# Patient Record
Sex: Female | Born: 1980 | Race: White | Hispanic: No | Marital: Married | Smoking: Never smoker
Health system: Southern US, Community
[De-identification: ages and names within clinical notes are randomized; demographics above are authoritative.]

## PROBLEM LIST (undated history)

## (undated) ENCOUNTER — Inpatient Hospital Stay (HOSPITAL_COMMUNITY): Payer: Self-pay

## (undated) DIAGNOSIS — F32A Depression, unspecified: Secondary | ICD-10-CM

## (undated) DIAGNOSIS — Z9889 Other specified postprocedural states: Secondary | ICD-10-CM

## (undated) DIAGNOSIS — T8859XA Other complications of anesthesia, initial encounter: Secondary | ICD-10-CM

## (undated) DIAGNOSIS — F419 Anxiety disorder, unspecified: Secondary | ICD-10-CM

## (undated) DIAGNOSIS — K219 Gastro-esophageal reflux disease without esophagitis: Secondary | ICD-10-CM

## (undated) DIAGNOSIS — R51 Headache: Secondary | ICD-10-CM

## (undated) DIAGNOSIS — J45909 Unspecified asthma, uncomplicated: Secondary | ICD-10-CM

## (undated) DIAGNOSIS — T4145XA Adverse effect of unspecified anesthetic, initial encounter: Secondary | ICD-10-CM

## (undated) DIAGNOSIS — I10 Essential (primary) hypertension: Secondary | ICD-10-CM

## (undated) DIAGNOSIS — E611 Iron deficiency: Secondary | ICD-10-CM

## (undated) DIAGNOSIS — M1990 Unspecified osteoarthritis, unspecified site: Secondary | ICD-10-CM

## (undated) DIAGNOSIS — O009 Unspecified ectopic pregnancy without intrauterine pregnancy: Secondary | ICD-10-CM

## (undated) DIAGNOSIS — O139 Gestational [pregnancy-induced] hypertension without significant proteinuria, unspecified trimester: Secondary | ICD-10-CM

## (undated) DIAGNOSIS — F329 Major depressive disorder, single episode, unspecified: Secondary | ICD-10-CM

## (undated) DIAGNOSIS — Z8719 Personal history of other diseases of the digestive system: Secondary | ICD-10-CM

## (undated) HISTORY — PX: PLANTAR FASCIA SURGERY: SHX746

## (undated) HISTORY — DX: Gestational (pregnancy-induced) hypertension without significant proteinuria, unspecified trimester: O13.9

## (undated) HISTORY — DX: Headache: R51

## (undated) HISTORY — DX: Adverse effect of unspecified anesthetic, initial encounter: T41.45XA

## (undated) HISTORY — PX: OTHER SURGICAL HISTORY: SHX169

## (undated) HISTORY — DX: Unspecified asthma, uncomplicated: J45.909

## (undated) HISTORY — DX: Major depressive disorder, single episode, unspecified: F32.9

## (undated) HISTORY — DX: Unspecified ectopic pregnancy without intrauterine pregnancy: O00.90

## (undated) HISTORY — DX: Essential (primary) hypertension: I10

## (undated) HISTORY — PX: UPPER GI ENDOSCOPY: SHX6162

## (undated) HISTORY — DX: Iron deficiency: E61.1

## (undated) HISTORY — DX: Other complications of anesthesia, initial encounter: T88.59XA

## (undated) HISTORY — DX: Depression, unspecified: F32.A

## (undated) HISTORY — PX: COLPOSCOPY VULVA: SUR281

## (undated) HISTORY — DX: Anxiety disorder, unspecified: F41.9

---

## 2003-12-16 HISTORY — PX: WISDOM TOOTH EXTRACTION: SHX21

## 2006-08-04 ENCOUNTER — Other Ambulatory Visit: Admission: RE | Admit: 2006-08-04 | Discharge: 2006-08-04 | Payer: Self-pay | Admitting: Family Medicine

## 2008-03-06 ENCOUNTER — Inpatient Hospital Stay (HOSPITAL_COMMUNITY): Admission: AD | Admit: 2008-03-06 | Discharge: 2008-03-09 | Payer: Self-pay

## 2008-03-07 ENCOUNTER — Encounter (INDEPENDENT_AMBULATORY_CARE_PROVIDER_SITE_OTHER): Payer: Self-pay | Admitting: Obstetrics and Gynecology

## 2010-06-12 ENCOUNTER — Encounter: Admission: RE | Admit: 2010-06-12 | Discharge: 2010-06-12 | Payer: Self-pay | Admitting: Family Medicine

## 2011-04-29 NOTE — H&P (Signed)
NAMEBLAKELEY, SCHEIER      ACCOUNT NO.:  [id number]   MEDICAL RECORD NO.:  [id number]          PATIENT TYPE:  INP   LOCATION:                                FACILITY:  [facility]   PHYSICIAN:  Janine Limbo, M.D.DATE OF BIRTH:  26-Dec-1980   DATE OF ADMISSION:  03/06/2008  DATE OF DISCHARGE:                              HISTORY & PHYSICAL   Patient is a 30 year old married white female gravida 2, para 0-0-1-0,  who is at 40-3/[redacted] weeks gestation, who presents on the day of admission  for induction of labor, secondary to oligohydramnios.  Patient was seen  at the office this morning for an ultrasound for size less than dates.  She had been running about a week to a week and a half behind on her  previous two visits, so she had the ultrasound this morning.  It showed  AFI equal to 5.7 cm which was less than third percentile.  Baby's  estimated fetal weight was 7 pounds 8 ounces which is 48th percentile  and umbilical artery Doppler was 1.07, which was slightly high, the  normal range is 0.66-1.03.  Patient's biophysical profile was 8/8.  Patient was vertex per ultrasound.  Placenta is posterior and grade III.  Patient did report decreased fetal movement over the weekend.  Her urine  was negative.  Her weight is 186 today.  Blood pressure was within  normal limits.  She denies any leakage of fluid, any vaginal bleeding,  any abnormal discharge.  She denies any PIH or UTI signs or symptoms,  shortness of breath, fever or cough.  She does not report any  contractions at this point.   PRENATAL LABORATORY DATA:  Patient's blood type is A positive.  Rh  antibody screen was negative.  Iron count was 13.3 at her new OB visit  in May 2008.  RPR nonreactive.  Rubella titer immune.  Hepatitis B  surface antigen negative.  HIV was nonreactive.  Her group B Strep is  negative.  GC and Chlamydia  cultures in third trimester were negative.  Hemoglobin and one hour Glucola was within normal  limits, it was equal  to 82.  Her hemoglobin was 12.4 at her one hour Glucola.   PAST MEDICAL HISTORY:  1. Patient reports menarche at 30 years of age with 28-day cycle.  2. She reports and ectopic pregnancy in March 2008.  3. She did use Ortho Tri-Cyclen for approximately nine years which she      stopped in December 2007.  4. She was treated for Chlamydia in 2001.  5. She had normal childhood illnesses as well as varicella as a child.  6. Patient does have a history of asthma.  She has a p.r.n. albuterol      inhaler.   PAST SURGICAL HISTORY:  Patient had her wisdom teeth out in the past.   GENETIC HISTORY:  Remarkable for a first cousin with webbed toes, first  cousin with a heart defect and is since deceased.  Her paternal  grandmother and the father of the baby's mom with scoliosis.   FAMILY HISTORY:  Maternal grandfather with heart attacks.  Maternal  grandmother heart attacks. Dad and paternal grandmother chronic  hypertension and on medications.  Maternal grandmother emphysema.  Paternal grandfather emphysema and deceased.  Maternal grandfather  diabetic on oral medications.  Paternal grandfather is deceased but he,  as well, was diabetic.  Dad is on thyroid medication.  Her maternal  grandmother breast cancer.   SOCIAL HISTORY:  Patient is a married white female.  She did not report  a religious affiliation.  Her husband's name is Rob Art therapist.  She does  report having a bachelor's degree and is Public house manager  full time.  Father of the baby has an Associate's degree and works for  [employer].  He is supportive.  Patient denies any alcohol,  tobacco or illicit drug use.   OBSTETRICAL HISTORY:  Rhett Bannister 1 was ectopic pregnancy in March 2008.  Rhett Bannister 2 is present pregnancy with EDC set by approximately 7-8 week  ultrasound for March 03, 2008.   HISTORY OF PRESENT PREGNANCY:  Patient initiated care at [facility] in July  2008 for her new OB interview.   Ultrasound was scheduled for her history  of recent ectopic pregnancy.  She had her new OB work-up at 11 weeks  that was toward the end of August 2008.  She desired C.N.M. for care.  She did have her Pap in spring 2008, per patient.  GC and Chlamydia  cultures were negative.  Patient did have some nausea and vomiting in  early pregnancy which was easily treatable with Zofran p.r.n.  She was  then seen at 15 weeks and was doing well, improving nausea and vomiting.  She was then seen at 18 weeks and had her anatomy ultrasound showing  SIUP, size consistent with dates, cervical length 3.93 cm, normal fluid,  posterior placenta, all anatomy was seen with normal growth and  development with exception of unable to see right outflow tract and plan  was made to follow up in two weeks to reassess.  There were no anomalies  observed at that time, suggest a female.  She did have some questionable  dyshydrotic eczema of her hands versus contact dermatitis or tinea  manuum.  Plan was made for referral to dermatology consult but by the  time that the consult was available, the eczematous areas had resolved.  They were around her left ring finger and they were pruritic as well and  then on her right middle and index fingers.  Patient had follow-up  ultrasound at 20-3/7 weeks for additional observation of right outflow  tract and all anatomy that was not previously seen was observed and no  anomalies were observed.  Cervical length was 3.21 cm, normal fluid with  posterior placenta.  Patient then had her one-hour GTT at 28-5/7 weeks  which was within normal limits and hemoglobin at that time was 12.4.  She was doing well.  Patient's pregnancy continued to progress without  significant complications until her visit today when ultrasound was done  secondary to size slightly less than dates with fundal heights and  showing oligohydramnios.   OBJECTIVE:  VITAL SIGNS:  Patient's weight today at the office was  186.  Her UA was negative.  Her blood pressure was 120/88.  Pulse was within  normal limits.  She did not have a temperature taken here.  GENERAL APPEARANCE:  No acute distress.  Alert and oriented x3 and  pleasant.  HEENT:  Grossly intact and within normal limits.  SKIN:  Warm, dry and intact.  LUNGS:  Clear to auscultation bilaterally.  CARDIOVASCULAR:  Regular rate and rhythm without murmur.  ABDOMEN:  Soft and nontender.  Gravid.  PELVIC:  Cervix was fingertip, 50%, -2 and vertex.  No abnormal  discharge or leakage of fluid.  EXTREMITIES:  Within normal limits.  Deep tendon reflexes were 2+.  No  clonus.  Negative Homan's and no edema.   Ultrasound:  Please see history of present illness.   IMPRESSION:  1. Intrauterine pregnancy at 40-3/7 weeks.  2. Oligohydramnios with amniotic fluid index equal to 5.7, less than      third percentile on ultrasound today.  3. Slightly elevated umbilical right renal artery Doppler at 1.07 with      normal range 0.66-1.03.  4. Group Beta Strep negative.  5. Penicillin allergy.   PLAN:  1. Consulted with Dr. Stefano Gaul.  Per Dr. Stefano Gaul, patient was offered      option of      a.     Going home on bed rest and drinking increased amount of       fluids each day and coming back Wednesday or Thursday for repeat       ultrasound, or      b.     Direct admission for induction of labor secondary to       oligohydramnios.  2. Patient did opt for induction of labor secondary to decreased fetal      movement and discuss with Hilda Lias L. Mayford Knife, C.N.M., on call      patient status and election of induction.  3. Routine C.N.M. orders and admit to L&D as direct admit with Dr.      Stefano Gaul as attending physician.  4. Plan Cervidil or Cytotec p.r.n. cervical ripening and Pitocin and      artificial rupture of membranes thereafter for continued      augmentation.      Candice Denny Levy, [place]      Janine Limbo, M.D.  Electronically  Signed    CHS/MEDQ  D:  03/06/2008  T:  03/06/2008  Job:  [id number]

## 2011-09-08 LAB — CBC
HCT: 33.3 — ABNORMAL LOW
HCT: 39.3
MCHC: 35.1
MCHC: 35.2
MCV: 88.5
Platelets: 259
RBC: 3.73 — ABNORMAL LOW
RBC: 4.44
RDW: 13.3
RDW: 14.1
WBC: 11 — ABNORMAL HIGH
WBC: 18 — ABNORMAL HIGH

## 2012-04-28 ENCOUNTER — Other Ambulatory Visit: Payer: Self-pay | Admitting: Obstetrics and Gynecology

## 2012-05-06 ENCOUNTER — Other Ambulatory Visit: Payer: Self-pay | Admitting: Obstetrics and Gynecology

## 2012-05-06 MED ORDER — NORETHIN ACE-ETH ESTRAD-FE 1.5-30 MG-MCG PO TABS: 1.0000 | ORAL_TABLET | Freq: Every day | ORAL | Status: DC

## 2012-05-06 NOTE — Telephone Encounter (Signed)
TC to pt. LM to return call regarding message. 

## 2012-05-06 NOTE — Telephone Encounter (Signed)
Triage/epic 

## 2012-05-06 NOTE — Telephone Encounter (Signed)
TC from pt.   Requesting RF Microgestin  Until annual 05/25/12.  Denies any medical problems.  Needs to start new pack 05/07/12.  Rx ordered.

## 2012-05-24 DIAGNOSIS — R519 Headache, unspecified: Secondary | ICD-10-CM | POA: Insufficient documentation

## 2012-05-24 DIAGNOSIS — J45909 Unspecified asthma, uncomplicated: Secondary | ICD-10-CM | POA: Insufficient documentation

## 2012-05-24 DIAGNOSIS — F329 Major depressive disorder, single episode, unspecified: Secondary | ICD-10-CM | POA: Insufficient documentation

## 2012-05-24 DIAGNOSIS — R51 Headache: Secondary | ICD-10-CM | POA: Insufficient documentation

## 2012-05-24 DIAGNOSIS — F32A Depression, unspecified: Secondary | ICD-10-CM | POA: Insufficient documentation

## 2012-05-24 DIAGNOSIS — O009 Unspecified ectopic pregnancy without intrauterine pregnancy: Secondary | ICD-10-CM | POA: Insufficient documentation

## 2012-05-24 DIAGNOSIS — E611 Iron deficiency: Secondary | ICD-10-CM | POA: Insufficient documentation

## 2012-05-25 ENCOUNTER — Ambulatory Visit (INDEPENDENT_AMBULATORY_CARE_PROVIDER_SITE_OTHER): Payer: 59 | Admitting: Obstetrics and Gynecology

## 2012-05-25 ENCOUNTER — Encounter: Payer: Self-pay | Admitting: Obstetrics and Gynecology

## 2012-05-25 VITALS — BP 114/74 | Resp 16 | Ht 62.0 in | Wt 198.0 lb

## 2012-05-25 DIAGNOSIS — Z124 Encounter for screening for malignant neoplasm of cervix: Secondary | ICD-10-CM

## 2012-05-25 DIAGNOSIS — Z0000 Encounter for general adult medical examination without abnormal findings: Secondary | ICD-10-CM

## 2012-05-25 DIAGNOSIS — Z01419 Encounter for gynecological examination (general) (routine) without abnormal findings: Secondary | ICD-10-CM

## 2012-05-25 MED ORDER — FLUCONAZOLE 200 MG PO TABS: 200.0000 mg | ORAL_TABLET | ORAL | Status: AC

## 2012-05-25 MED ORDER — NORETHIN ACE-ETH ESTRAD-FE 1.5-30 MG-MCG PO TABS: 1.0000 | ORAL_TABLET | Freq: Every day | ORAL | Status: DC

## 2012-05-25 NOTE — Progress Notes (Signed)
Last Pap: 06/2010 WNL: Yes Regular Periods:yes Contraception: Yes  Monthly Breast exam:no Tetanus<58yrs:yes Nl.Bladder Function:yes Daily BMs:yes Healthy Diet:yes Calcium:yes/Multi-vitamin Mammogram:no Exercise:yes Seatbelt: yes Abuse at home: no Stressful work:yes Sigmoid-colonoscopy: Never had Bone Density: No  C/O of little white heads under right breast and occ get them on left breast

## 2012-05-25 NOTE — Progress Notes (Signed)
Calm, no distress, HEENT WNL, breasts bilaterally no dimpling masses or drainage lungs clear bilaterally, AP RRR, abd soft nt,no masses, not tympanic bowel sounds active, abdomen nontender, Normal hair distrubition mons pubis,  EGBUS WNL,  sterile speculum exam vagina pink, moist normal rugae,  normal appearing cervix without discharge or lesions No adnexal masses or tenderness edema to lower extremities A normal annual exam with breast candida P discussed RX diflucan, OTC nystatin, Baking soda bathes x20 minutes BID F/O  As scheduled   Prn, annually, discussed routine health maintence labs by PCP, nutrition, weight loss, exercise. Lavera Guise, CNM

## 2012-05-26 LAB — PAP IG W/ RFLX HPV ASCU

## 2013-03-04 ENCOUNTER — Encounter (HOSPITAL_COMMUNITY): Payer: Self-pay | Admitting: Emergency Medicine

## 2013-03-04 ENCOUNTER — Emergency Department (HOSPITAL_COMMUNITY)
Admission: EM | Admit: 2013-03-04 | Discharge: 2013-03-04 | Disposition: A | Discharge status: Home / Self Care | Payer: 59 | Attending: Emergency Medicine | Admitting: Emergency Medicine

## 2013-03-04 DIAGNOSIS — Z209 Contact with and (suspected) exposure to unspecified communicable disease: Secondary | ICD-10-CM

## 2013-03-04 DIAGNOSIS — F329 Major depressive disorder, single episode, unspecified: Secondary | ICD-10-CM | POA: Insufficient documentation

## 2013-03-04 DIAGNOSIS — Z203 Contact with and (suspected) exposure to rabies: Secondary | ICD-10-CM | POA: Insufficient documentation

## 2013-03-04 DIAGNOSIS — F3289 Other specified depressive episodes: Secondary | ICD-10-CM | POA: Insufficient documentation

## 2013-03-04 DIAGNOSIS — Z23 Encounter for immunization: Secondary | ICD-10-CM | POA: Insufficient documentation

## 2013-03-04 DIAGNOSIS — Z862 Personal history of diseases of the blood and blood-forming organs and certain disorders involving the immune mechanism: Secondary | ICD-10-CM | POA: Insufficient documentation

## 2013-03-04 DIAGNOSIS — J45909 Unspecified asthma, uncomplicated: Secondary | ICD-10-CM | POA: Insufficient documentation

## 2013-03-04 DIAGNOSIS — Z8669 Personal history of other diseases of the nervous system and sense organs: Secondary | ICD-10-CM | POA: Insufficient documentation

## 2013-03-04 MED ORDER — RABIES VACCINE, PCEC IM SUSR
1.0000 mL | Freq: Once | INTRAMUSCULAR | Status: AC
  Administered 2013-03-04: 1 mL via INTRAMUSCULAR
  Filled 2013-03-04: qty 1

## 2013-03-04 MED ORDER — RABIES IMMUNE GLOBULIN 150 UNIT/ML IM INJ
20.0000 [IU]/kg | INJECTION | Freq: Once | INTRAMUSCULAR | Status: AC
  Administered 2013-03-04: 1875 [IU] via INTRAMUSCULAR
  Filled 2013-03-04: qty 12.5

## 2013-03-04 NOTE — ED Notes (Signed)
Kristi Wu was in her bedroom last Saturday. It was found dead outside her bedroom today

## 2013-03-04 NOTE — ED Provider Notes (Signed)
History    This chart was scribed for non-physician practitioner working with Nelia Shi, MD by Donne Anon, ED Scribe. This patient was seen in room PED7/PED07 and the patient's care was started at 1617.   CSN: [id number]  Arrival date & time 03/04/13  1558   First MD Initiated Contact with Patient 03/04/13 1617      Chief Complaint  Patient presents with  . Rabies Injection     The history is provided by the patient. No language interpreter was used.   Kristi Wu is a 32 y.o. female who presents to the Emergency Department complaining of sudden onset bat exposure. Pt reports 6 days ago she saw a bat in the bedroom while the whole family was sleeping in the bedroom. Pt states they found a dead bat on their bedroom window today. Pt denies any other pain. Past Medical History  Diagnosis Date  . Ectopic pregnancy   . Depression   . Asthma   . Headache   . Low iron     Past Surgical History  Procedure Laterality Date  . Wisdom tooth extraction  2005    Family History  Problem Relation Age of Onset  . Asthma Mother   . Hypertension Father   . Thyroid disease Father   . Emphysema Maternal Grandmother   . Cancer Maternal Grandmother     Breast  . Diabetes Maternal Grandfather   . Diabetes Paternal Grandfather   . Emphysema Paternal Grandfather     History  Substance Use Topics  . Smoking status: Never Smoker   . Smokeless tobacco: Never Used  . Alcohol Use: No    OB History   Grav Para Term Preterm Abortions TAB SAB Ect Mult Living   2 1 1  1   1  1       Review of Systems  Constitutional: Negative for fever.  Gastrointestinal: Negative for vomiting.  Skin: Negative for rash.  All other systems reviewed and are negative.    Allergies  Amoxicillin; Doxycycline; Penicillins; and Sulfa antibiotics  Home Medications   Current Outpatient Rx  Name  Route  Sig  Dispense  Refill  . norethindrone-ethinyl estradiol-iron (MICROGESTIN FE 1.5/30)  1.5-30 MG-MCG tablet   Oral   Take 1 tablet by mouth daily.   1 Package   0     BP 130/86  Pulse 96  Temp(Src) 98.7 F (37.1 C) (Oral)  Wt 205 lb 9.6 oz (93.26 kg)  BMI 37.6 kg/m2  SpO2 99%  LMP 03/03/2013  Physical Exam  Nursing note and vitals reviewed. Constitutional: She is oriented to person, place, and time. She appears well-developed and well-nourished. No distress.  HENT:  Head: Normocephalic and atraumatic.  Eyes: EOM are normal.  Neck: Neck supple.  Cardiovascular: Normal rate.   Pulmonary/Chest: Effort normal. No respiratory distress.  Musculoskeletal: Normal range of motion.  Neurological: She is alert and oriented to person, place, and time.  Skin: Skin is warm and dry.  Psychiatric: She has a normal mood and affect. Her behavior is normal.    ED Course  Procedures (including critical care time) DIAGNOSTIC STUDIES: Oxygen Saturation is 99% on room air, normal by my interpretation.    COORDINATION OF CARE: 4:21 PM Discussed treatment plan which includes a rabies vacine with pt at bedside and pt agreed to plan.     Labs Reviewed - No data to display No results found.   1. Exposure to bat without known bite  MDM   Pt with bat exposure. No bites. No sigs of rabies. Given immunoglobulin here and rabies vaccine. Will follow up for further series at [facility].   I personally performed the services described in this documentation, which was scribed in my presence. The recorded information has been reviewed and is accurate.         Lottie Mussel, PA-C 03/04/13 1653

## 2013-03-05 NOTE — ED Provider Notes (Signed)
Medical screening examination/treatment/procedure(s) were performed by non-physician practitioner and as supervising physician I was immediately available for consultation/collaboration.   Arabelle Bollig L Elara Cocke, MD 03/05/13 1227 

## 2013-03-08 ENCOUNTER — Emergency Department (HOSPITAL_COMMUNITY)
Admission: EM | Admit: 2013-03-08 | Discharge: 2013-03-08 | Disposition: A | Discharge status: Home / Self Care | Payer: 59 | Source: Home / Self Care

## 2013-03-08 ENCOUNTER — Encounter (HOSPITAL_COMMUNITY): Payer: Self-pay | Admitting: Emergency Medicine

## 2013-03-08 DIAGNOSIS — Z203 Contact with and (suspected) exposure to rabies: Secondary | ICD-10-CM

## 2013-03-08 MED ORDER — RABIES VACCINE, PCEC IM SUSR
INTRAMUSCULAR | Status: AC
  Filled 2013-03-08: qty 1

## 2013-03-08 MED ORDER — RABIES VACCINE, PCEC IM SUSR
1.0000 mL | Freq: Once | INTRAMUSCULAR | Status: AC
  Administered 2013-03-08: 1 mL via INTRAMUSCULAR

## 2013-03-08 NOTE — ED Notes (Signed)
Patient in department for rabies injection.  Mother and child being seen and given injections

## 2013-03-11 ENCOUNTER — Emergency Department (INDEPENDENT_AMBULATORY_CARE_PROVIDER_SITE_OTHER)
Admission: EM | Admit: 2013-03-11 | Discharge: 2013-03-11 | Disposition: A | Discharge status: Home / Self Care | Payer: 59 | Source: Home / Self Care

## 2013-03-11 ENCOUNTER — Encounter (HOSPITAL_COMMUNITY): Payer: Self-pay

## 2013-03-11 DIAGNOSIS — Z203 Contact with and (suspected) exposure to rabies: Secondary | ICD-10-CM

## 2013-03-11 MED ORDER — RABIES VACCINE, PCEC IM SUSR
1.0000 mL | Freq: Once | INTRAMUSCULAR | Status: AC
  Administered 2013-03-11: 1 mL via INTRAMUSCULAR

## 2013-03-11 MED ORDER — RABIES VACCINE, PCEC IM SUSR
INTRAMUSCULAR | Status: AC
  Filled 2013-03-11: qty 1

## 2013-03-11 NOTE — ED Notes (Signed)
Here for day #7 in rabies series; denies other concerns

## 2013-03-19 ENCOUNTER — Emergency Department (INDEPENDENT_AMBULATORY_CARE_PROVIDER_SITE_OTHER)
Admission: EM | Admit: 2013-03-19 | Discharge: 2013-03-19 | Disposition: A | Discharge status: Home / Self Care | Payer: 59 | Source: Home / Self Care

## 2013-03-19 ENCOUNTER — Encounter (HOSPITAL_COMMUNITY): Payer: Self-pay | Admitting: Emergency Medicine

## 2013-03-19 DIAGNOSIS — Z23 Encounter for immunization: Secondary | ICD-10-CM

## 2013-03-19 MED ORDER — RABIES VACCINE, PCEC IM SUSR
INTRAMUSCULAR | Status: AC
  Filled 2013-03-19: qty 1

## 2013-03-19 MED ORDER — RABIES VACCINE, PCEC IM SUSR
1.0000 mL | Freq: Once | INTRAMUSCULAR | Status: AC
  Administered 2013-03-19: 1 mL via INTRAMUSCULAR

## 2013-03-19 NOTE — ED Notes (Signed)
Pt is here for the 4th rabies vaccination Denies any new medical prob  She is alert and oriented w/no signs of acute distress.  

## 2014-10-16 ENCOUNTER — Encounter (HOSPITAL_COMMUNITY): Payer: Self-pay | Admitting: Emergency Medicine

## 2015-04-09 ENCOUNTER — Other Ambulatory Visit: Payer: Self-pay | Admitting: Family

## 2015-04-09 ENCOUNTER — Ambulatory Visit
Admission: RE | Admit: 2015-04-09 | Discharge: 2015-04-09 | Disposition: A | Discharge status: Home / Self Care | Payer: PRIVATE HEALTH INSURANCE | Source: Ambulatory Visit | Attending: Family | Admitting: Family

## 2015-04-09 DIAGNOSIS — R0781 Pleurodynia: Secondary | ICD-10-CM

## 2016-05-29 DIAGNOSIS — F418 Other specified anxiety disorders: Secondary | ICD-10-CM | POA: Diagnosis not present

## 2016-05-29 DIAGNOSIS — J4520 Mild intermittent asthma, uncomplicated: Secondary | ICD-10-CM | POA: Diagnosis not present

## 2016-05-29 DIAGNOSIS — L309 Dermatitis, unspecified: Secondary | ICD-10-CM | POA: Diagnosis not present

## 2016-05-29 DIAGNOSIS — G43009 Migraine without aura, not intractable, without status migrainosus: Secondary | ICD-10-CM | POA: Diagnosis not present

## 2016-06-12 DIAGNOSIS — B360 Pityriasis versicolor: Secondary | ICD-10-CM | POA: Diagnosis not present

## 2016-06-24 DIAGNOSIS — R42 Dizziness and giddiness: Secondary | ICD-10-CM | POA: Diagnosis not present

## 2016-06-24 DIAGNOSIS — R6882 Decreased libido: Secondary | ICD-10-CM | POA: Diagnosis not present

## 2016-06-24 DIAGNOSIS — Z124 Encounter for screening for malignant neoplasm of cervix: Secondary | ICD-10-CM | POA: Diagnosis not present

## 2016-06-24 DIAGNOSIS — Z01419 Encounter for gynecological examination (general) (routine) without abnormal findings: Secondary | ICD-10-CM | POA: Diagnosis not present

## 2016-12-14 ENCOUNTER — Ambulatory Visit (HOSPITAL_COMMUNITY)
Admission: EM | Admit: 2016-12-14 | Discharge: 2016-12-14 | Disposition: A | Discharge status: Home / Self Care | Payer: BLUE CROSS/BLUE SHIELD | Attending: Emergency Medicine | Admitting: Emergency Medicine

## 2016-12-14 ENCOUNTER — Encounter (HOSPITAL_COMMUNITY): Payer: Self-pay | Admitting: Emergency Medicine

## 2016-12-14 DIAGNOSIS — R1011 Right upper quadrant pain: Secondary | ICD-10-CM | POA: Diagnosis not present

## 2016-12-14 LAB — POCT PREGNANCY, URINE: Preg Test, Ur: NEGATIVE

## 2016-12-14 MED ORDER — HYDROCODONE-ACETAMINOPHEN 5-325 MG PO TABS: 1.0000 | ORAL_TABLET | ORAL | 0 refills | Status: DC | PRN

## 2016-12-14 NOTE — ED Triage Notes (Signed)
Epigastric pain, center epigastric pain sometimes, but most of the time pain is right torso.  No nausea or vomiting.  Pain is sharp.  Intermittent for a year.  However, this pain has been present every day for the past week, but does occur about 30 minutes after eating.  Last bm was this morning-normal per patient.  No issues with urinating.

## 2016-12-14 NOTE — Discharge Instructions (Addendum)
Based on your history and risk factors it is likely her pain is originating from your gallbladder system. He will need to follow-up with your primary care provider as soon as possible and obtain and abdominal ultrasound. He will need to do this as soon as possible as your symptoms are likely to continue and worsen. If you develop increasing pain, vomiting, fever, bleeding from anywhere or worsening in any other way go directly to the emergency department. You are given medicine for pain attacks. We discussed the fact that you have not actually been diagnosed with any particular disease or condition at this time. Do not take more medicine than is prescribed and if you continue to have pain and other symptoms despite taking medications you are still to go to the emergency department. Try to eliminate as much fat, grease, oils as possible your diet. Be sure to drink plenty of fluids to stay well-hydrated.

## 2016-12-14 NOTE — ED Provider Notes (Signed)
CSN: [id number]     Arrival date & time 12/14/16  1234 History   First MD Initiated Contact with Patient 12/14/16 1535     Chief Complaint  Patient presents with  . Abdominal Pain   (Consider location/radiation/quality/duration/timing/severity/associated sxs/prior Treatment) 35 year old female complaining of right lateral lower chest pain, right upper quadrant and occasional epigastric pain off and on for one year. She describes it as a sharp stabbing sensation. Over the past 1-2 weeks it is increased in intensity and is almost always exacerbated or elicited after eating. Sometimes she will have pain without eating. It will last anywhere between 12 hours it may become moderate to severe. It is not associated with nausea, vomiting, diarrhea or bleeding. Denies any known fevers. Denies constipation or diarrhea.      Past Medical History:  Diagnosis Date  . Asthma   . Depression   . Ectopic pregnancy   . Headache(784.0)   . Low iron    Past Surgical History:  Procedure Laterality Date  . WISDOM TOOTH EXTRACTION  2005   Family History  Problem Relation Age of Onset  . Asthma Mother   . Hypertension Father   . Thyroid disease Father   . Emphysema Maternal Grandmother   . Cancer Maternal Grandmother     Breast  . Diabetes Maternal Grandfather   . Diabetes Paternal Grandfather   . Emphysema Paternal Grandfather    Social History  Substance Use Topics  . Smoking status: Never Smoker  . Smokeless tobacco: Never Used  . Alcohol use No   OB History    Gravida Para Term Preterm AB Living   2 1 1   1 1    SAB TAB Ectopic Multiple Live Births       1   1     Review of Systems  Constitutional: Negative.   HENT: Negative.   Respiratory: Negative.   Gastrointestinal: Positive for abdominal pain. Negative for blood in stool, constipation, nausea and vomiting.  Genitourinary: Negative.   Musculoskeletal: Negative.   Skin: Negative.   Neurological: Negative.   All other  systems reviewed and are negative.   Allergies  Amoxicillin; Doxycycline; Penicillins; and Sulfa antibiotics  Home Medications   Prior to Admission medications   Medication Sig Start Date End Date Taking? Authorizing Provider  HYDROcodone-acetaminophen (NORCO/VICODIN) 5-325 MG tablet Take 1 tablet by mouth every 4 (four) hours as needed. 12/14/16   Hayden Rasmussenavid Kahmya Pinkham, NP   Meds Ordered and Administered this Visit  Medications - No data to display  BP 133/76 (BP Location: Left Arm)   Pulse 77   Temp 98.1 F (36.7 C) (Oral)   Resp 18   SpO2 100%  No data found.   Physical Exam  Constitutional: She is oriented to person, place, and time. She appears well-developed and well-nourished. No distress.  Obese  HENT:  Head: Normocephalic and atraumatic.  Eyes: Conjunctivae and EOM are normal.  Neck: Normal range of motion. Neck supple.  Cardiovascular: Normal rate, regular rhythm and normal heart sounds.   Pulmonary/Chest: Effort normal and breath sounds normal. No respiratory distress. She has no wheezes.  Abdominal: Soft. Bowel sounds are normal. She exhibits no distension and no mass. There is no rebound and no guarding.  Only minor tenderness in the epigastrium and right upper quadrant with very deep palpation. It is not consistent. Negative Murphy sign. No tenderness to the right lateral chest or abdomen.  Musculoskeletal: Normal range of motion. She exhibits no edema or tenderness.  Lymphadenopathy:  She has no cervical adenopathy.  Neurological: She is alert and oriented to person, place, and time. No cranial nerve deficit.  Skin: Skin is warm and dry. No rash noted.  Psychiatric: She has a normal mood and affect.  Nursing note and vitals reviewed.   Urgent Care Course   Clinical Course     Procedures (including critical care time)  Labs Review Labs Reviewed - No data to display  Imaging Review No results found.   Visual Acuity Review  Right Eye Distance:   Left  Eye Distance:   Bilateral Distance:    Right Eye Near:   Left Eye Near:    Bilateral Near:         MDM   1. Right upper quadrant abdominal pain   Right upper quadrant, occasionally epigastric pain and right lower to mid chest pain likely due to cholestatic disease. Patient will need to have an ultrasound. She will need to follow-up with her PCP on Tuesday call early for an appointment. Patient is aware that this is not a diagnosis but a differential. She states she understands. She realizes that she could have other problems that could be serious. She also realizes that taking medications for pain can mask symptoms and should not ignore them or try to take additional medication when having severe pain. Based on your history and risk factors it is likely her pain is originating from your gallbladder system. He will need to follow-up with your primary care provider as soon as possible and obtain and abdominal ultrasound. He will need to do this as soon as possible as your symptoms are likely to continue and worsen. If you develop increasing pain, vomiting, fever, bleeding from anywhere or worsening in any other way go directly to the emergency department. You are given medicine for pain attacks. We discussed the fact that you have not actually been diagnosed with any particular disease or condition at this time. Do not take more medicine than is prescribed and if you continue to have pain and other symptoms despite taking medications you are still to go to the emergency department. Try to eliminate as much fat, grease, oils as possible your diet. Be sure to drink plenty of fluids to stay well-hydrated. Meds ordered this encounter  Medications  . HYDROcodone-acetaminophen (NORCO/VICODIN) 5-325 MG tablet    Sig: Take 1 tablet by mouth every 4 (four) hours as needed.    Dispense:  10 tablet    Refill:  0    Order Specific Question:   Supervising Provider    Answer:   Micheline ChapmanHONIG, ERIN J [[id number]]         Hayden Rasmussenavid Atina Feeley, NP 12/14/16 [phone]

## 2016-12-16 DIAGNOSIS — R109 Unspecified abdominal pain: Secondary | ICD-10-CM | POA: Diagnosis not present

## 2016-12-17 DIAGNOSIS — R109 Unspecified abdominal pain: Secondary | ICD-10-CM | POA: Diagnosis not present

## 2016-12-17 DIAGNOSIS — K760 Fatty (change of) liver, not elsewhere classified: Secondary | ICD-10-CM | POA: Diagnosis not present

## 2017-01-09 DIAGNOSIS — Z23 Encounter for immunization: Secondary | ICD-10-CM | POA: Diagnosis not present

## 2017-01-09 DIAGNOSIS — R1011 Right upper quadrant pain: Secondary | ICD-10-CM | POA: Diagnosis not present

## 2017-01-13 ENCOUNTER — Other Ambulatory Visit (HOSPITAL_COMMUNITY): Payer: Self-pay | Admitting: Family Medicine

## 2017-01-13 DIAGNOSIS — R1011 Right upper quadrant pain: Secondary | ICD-10-CM

## 2017-01-22 ENCOUNTER — Ambulatory Visit (HOSPITAL_COMMUNITY)
Admission: RE | Admit: 2017-01-22 | Discharge: 2017-01-22 | Disposition: A | Discharge status: Home / Self Care | Payer: BLUE CROSS/BLUE SHIELD | Source: Ambulatory Visit | Attending: Family Medicine | Admitting: Family Medicine

## 2017-01-22 DIAGNOSIS — R1011 Right upper quadrant pain: Secondary | ICD-10-CM | POA: Diagnosis not present

## 2017-01-22 MED ORDER — TECHNETIUM TC 99M MEBROFENIN IV KIT
5.0000 | PACK | Freq: Once | INTRAVENOUS | Status: AC | PRN
  Administered 2017-01-22: 5 via INTRAVENOUS

## 2017-01-27 DIAGNOSIS — R194 Change in bowel habit: Secondary | ICD-10-CM | POA: Diagnosis not present

## 2017-01-27 DIAGNOSIS — R197 Diarrhea, unspecified: Secondary | ICD-10-CM | POA: Diagnosis not present

## 2017-01-27 DIAGNOSIS — R1011 Right upper quadrant pain: Secondary | ICD-10-CM | POA: Diagnosis not present

## 2017-07-30 ENCOUNTER — Other Ambulatory Visit: Payer: Self-pay | Admitting: Obstetrics and Gynecology

## 2017-09-17 DIAGNOSIS — E559 Vitamin D deficiency, unspecified: Secondary | ICD-10-CM | POA: Diagnosis not present

## 2017-10-09 DIAGNOSIS — J45909 Unspecified asthma, uncomplicated: Secondary | ICD-10-CM | POA: Diagnosis not present

## 2018-01-13 DIAGNOSIS — R112 Nausea with vomiting, unspecified: Secondary | ICD-10-CM | POA: Diagnosis not present

## 2018-01-13 DIAGNOSIS — J101 Influenza due to other identified influenza virus with other respiratory manifestations: Secondary | ICD-10-CM | POA: Diagnosis not present

## 2018-01-13 DIAGNOSIS — R509 Fever, unspecified: Secondary | ICD-10-CM | POA: Diagnosis not present

## 2018-02-25 ENCOUNTER — Other Ambulatory Visit: Payer: Self-pay | Admitting: Obstetrics and Gynecology

## 2018-02-25 DIAGNOSIS — F419 Anxiety disorder, unspecified: Secondary | ICD-10-CM | POA: Diagnosis not present

## 2018-02-25 DIAGNOSIS — D071 Carcinoma in situ of vulva: Secondary | ICD-10-CM | POA: Diagnosis not present

## 2018-02-25 DIAGNOSIS — N926 Irregular menstruation, unspecified: Secondary | ICD-10-CM | POA: Diagnosis not present

## 2018-08-05 ENCOUNTER — Inpatient Hospital Stay (HOSPITAL_COMMUNITY): Payer: BLUE CROSS/BLUE SHIELD

## 2018-08-05 ENCOUNTER — Encounter (HOSPITAL_COMMUNITY): Payer: Self-pay

## 2018-08-05 ENCOUNTER — Other Ambulatory Visit: Payer: Self-pay

## 2018-08-05 ENCOUNTER — Inpatient Hospital Stay (HOSPITAL_COMMUNITY)
Admission: AD | Admit: 2018-08-05 | Discharge: 2018-08-05 | Disposition: A | Discharge status: Home / Self Care | Payer: BLUE CROSS/BLUE SHIELD | Source: Ambulatory Visit | Attending: Obstetrics & Gynecology | Admitting: Obstetrics & Gynecology

## 2018-08-05 DIAGNOSIS — O26851 Spotting complicating pregnancy, first trimester: Secondary | ICD-10-CM | POA: Diagnosis not present

## 2018-08-05 DIAGNOSIS — Z880 Allergy status to penicillin: Secondary | ICD-10-CM | POA: Insufficient documentation

## 2018-08-05 DIAGNOSIS — O209 Hemorrhage in early pregnancy, unspecified: Secondary | ICD-10-CM

## 2018-08-05 DIAGNOSIS — O219 Vomiting of pregnancy, unspecified: Secondary | ICD-10-CM | POA: Diagnosis not present

## 2018-08-05 DIAGNOSIS — Z3A01 Less than 8 weeks gestation of pregnancy: Secondary | ICD-10-CM | POA: Insufficient documentation

## 2018-08-05 DIAGNOSIS — O210 Mild hyperemesis gravidarum: Secondary | ICD-10-CM | POA: Diagnosis not present

## 2018-08-05 HISTORY — DX: Personal history of other diseases of the digestive system: Z87.19

## 2018-08-05 LAB — URINALYSIS, ROUTINE W REFLEX MICROSCOPIC
Bilirubin Urine: NEGATIVE
Glucose, UA: NEGATIVE mg/dL
KETONES UR: NEGATIVE mg/dL
NITRITE: NEGATIVE
PROTEIN: NEGATIVE mg/dL
Specific Gravity, Urine: 1.018 (ref 1.005–1.030)
pH: 7 (ref 5.0–8.0)

## 2018-08-05 LAB — POCT PREGNANCY, URINE: Preg Test, Ur: POSITIVE — AB

## 2018-08-05 MED ORDER — ONDANSETRON HCL 4 MG PO TABS: 4.0000 mg | ORAL_TABLET | Freq: Every day | ORAL | 1 refills | Status: DC | PRN

## 2018-08-05 MED ORDER — ONDANSETRON HCL 4 MG/2ML IJ SOLN
4.0000 mg | Freq: Once | INTRAMUSCULAR | Status: AC
  Administered 2018-08-05: 4 mg via INTRAVENOUS
  Filled 2018-08-05: qty 2

## 2018-08-05 MED ORDER — LACTATED RINGERS IV BOLUS
500.0000 mL | Freq: Once | INTRAVENOUS | Status: AC
  Administered 2018-08-05: 500 mL via INTRAVENOUS

## 2018-08-05 MED ORDER — METOCLOPRAMIDE HCL 5 MG/ML IJ SOLN
10.0000 mg | Freq: Once | INTRAMUSCULAR | Status: AC
  Administered 2018-08-05: 10 mg via INTRAVENOUS
  Filled 2018-08-05: qty 2

## 2018-08-05 NOTE — Discharge Instructions (Signed)
Morning Sickness °Morning sickness is when you feel sick to your stomach (nauseous) during pregnancy. This nauseous feeling may or may not come with vomiting. It often occurs in the morning but can be a problem any time of day. Morning sickness is most common during the first trimester, but it may continue throughout pregnancy. While morning sickness is unpleasant, it is usually harmless unless you develop severe and continual vomiting (hyperemesis gravidarum). This condition requires more intense treatment. °What are the causes? °The cause of morning sickness is not completely known but seems to be related to normal hormonal changes that occur in pregnancy. °What increases the risk? °You are at greater risk if you: °· Experienced nausea or vomiting before your pregnancy. °· Had morning sickness during a previous pregnancy. °· Are pregnant with more than one baby, such as twins. ° °How is this treated? °Do not use any medicines (prescription, over-the-counter, or herbal) for morning sickness without first talking to your health care provider. Your health care provider may prescribe or recommend: °· Vitamin B6 supplements. °· Anti-nausea medicines. °· The herbal medicine ginger. ° °Follow these instructions at home: °· Only take over-the-counter or prescription medicines as directed by your health care provider. °· Taking multivitamins before getting pregnant can prevent or decrease the severity of morning sickness in most women. °· Eat a piece of dry toast or unsalted crackers before getting out of bed in the morning. °· Eat five or six small meals a day. °· Eat dry and bland foods (rice, baked potato). Foods high in carbohydrates are often helpful. °· Do not drink liquids with your meals. Drink liquids between meals. °· Avoid greasy, fatty, and spicy foods. °· Get someone to cook for you if the smell of any food causes nausea and vomiting. °· If you feel nauseous after taking prenatal vitamins, take the vitamins at  night or with a snack. °· Snack on protein foods (nuts, yogurt, cheese) between meals if you are hungry. °· Eat unsweetened gelatins for desserts. °· Wearing an acupressure wristband (worn for sea sickness) may be helpful. °· Acupuncture may be helpful. °· Do not smoke. °· Get a humidifier to keep the air in your house free of odors. °· Get plenty of fresh air. °Contact a health care provider if: °· Your home remedies are not working, and you need medicine. °· You feel dizzy or lightheaded. °· You are losing weight. °Get help right away if: °· You have persistent and uncontrolled nausea and vomiting. °· You pass out (faint). °This information is not intended to replace advice given to you by your health care provider. Make sure you discuss any questions you have with your health care provider. °Document Released: 01/22/2007 Document Revised: 05/08/2016 Document Reviewed: 05/18/2013 °Elsevier Interactive Patient Education © 2017 [employer] ° °

## 2018-08-05 NOTE — MAU Note (Signed)
Pt states that she has been unable to keep anything down for 2 weeks.

## 2018-08-05 NOTE — MAU Provider Note (Addendum)
Chief Complaint: nausea vomiting and some spotting  None    SUBJECTIVE HPI: Kristi Wu is a 37 y.o. G3P1011 at [redacted]w[redacted]d who presents to Maternity Admissions reporting nausea and vomiting.  Has been feeling bad for two weeks.  Has tried phenergan but can only take at night due to making her sleepy.  Pt states has lost 7 lbs in one week.  Has had a small amount of spotting this week.  Occ mild cramping.  Hx of ectopic pregnancy in past.  A positive blood type.  Location: vaginal spotting Quality: small Severity: 2/10 on pain scale Duration: today, vomiting x 2 weeks Associated signs and symptoms: tireded  Past Medical History:  Diagnosis Date  . Asthma   . Depression   . Ectopic pregnancy   . Headache(784.0)   . History of hiatal hernia   . Low iron    OB History  Gravida Para Term Preterm AB Living  3 1 1   1 1   SAB TAB Ectopic Multiple Live Births      1   1    # Outcome Date GA Lbr Len/2nd Weight Sex Delivery Anes PTL Lv  3 Current           2 Term 03/12/08   3118 g F Vag-Spont  N LIV  1 Ectopic 02/2007    U    DEC   Past Surgical History:  Procedure Laterality Date  . WISDOM TOOTH EXTRACTION  2005   Social History   Socioeconomic History  . Marital status: Married    Spouse name: Not on file  . Number of children: Not on file  . Years of education: Not on file  . Highest education level: Not on file  Occupational History  . Not on file  Social Needs  . Financial resource strain: Not on file  . Food insecurity:    Worry: Not on file    Inability: Not on file  . Transportation needs:    Medical: Not on file    Non-medical: Not on file  Tobacco Use  . Smoking status: Never Smoker  . Smokeless tobacco: Never Used  Substance and Sexual Activity  . Alcohol use: No  . Drug use: No  . Sexual activity: Yes    Birth control/protection: None  Lifestyle  . Physical activity:    Days per week: Not on file    Minutes per session: Not on file  . Stress:  Not on file  Relationships  . Social connections:    Talks on phone: Not on file    Gets together: Not on file    Attends religious service: Not on file    Active member of club or organization: Not on file    Attends meetings of clubs or organizations: Not on file    Relationship status: Not on file  . Intimate partner violence:    Fear of current or ex partner: Not on file    Emotionally abused: Not on file    Physically abused: Not on file    Forced sexual activity: Not on file  Other Topics Concern  . Not on file  Social History Narrative  . Not on file   Family History  Problem Relation Age of Onset  . Asthma Mother   . Hypertension Father   . Thyroid disease Father   . Emphysema Maternal Grandmother   . Cancer Maternal Grandmother        Breast  . Diabetes Maternal Grandfather   .  Diabetes Paternal Grandfather   . Emphysema Paternal Grandfather    No current facility-administered medications on file prior to encounter.    Current Outpatient Medications on File Prior to Encounter  Medication Sig Dispense Refill  . HYDROcodone-acetaminophen (NORCO/VICODIN) 5-325 MG tablet Take 1 tablet by mouth every 4 (four) hours as needed. 10 tablet 0   Allergies  Allergen Reactions  . Amoxicillin     unknown  . Doxycycline Hives  . Penicillins     unknown  . Sulfa Antibiotics     unknown    I have reviewed patient's Past Medical Hx, Surgical Hx, Family Hx, Social Hx, medications and allergies.   Review of Systems  Constitutional: Negative.   HENT: Negative.   Eyes: Negative.   Respiratory: Negative.   Cardiovascular: Negative.   Gastrointestinal: Negative.   Endocrine: Negative.   Genitourinary: Positive for vaginal bleeding.  Musculoskeletal: Negative.   Skin: Negative.   Allergic/Immunologic: Negative.   Neurological: Positive for headaches.  Hematological: Negative.   Psychiatric/Behavioral: Negative.   Gastrointestinal nausea and vomiting    OBJECTIVE Patient Vitals for the past 24 hrs:  BP Temp Temp src Pulse Resp Height Weight  08/05/18 1516 132/75 98.4 F (36.9 C) Oral (!) 102 18 5\' 2"  (1.575 m) 108.4 kg   Constitutional: Well-developed, well-nourished female in no acute distress.  Cardiovascular: normal rate Respiratory: normal rate and effort.  GI: Abd soft, non-tender, gravid appropriate for gestational age. Pos BS x 4 MS: Extremities nontender, no edema, normal ROM Neurologic: Alert and oriented x 4.  GU: Neg CVAT.  LAB RESULTS Results for orders placed or performed during the hospital encounter of 08/05/18 (from the past 24 hour(s))  Pregnancy, urine POC     Status: Abnormal   Collection Time: 08/05/18  3:20 PM  Result Value Ref Range   Preg Test, Ur POSITIVE (A) NEGATIVE  Urinalysis, Routine w reflex microscopic     Status: Abnormal   Collection Time: 08/05/18  3:22 PM  Result Value Ref Range   Color, Urine YELLOW YELLOW   APPearance CLOUDY (A) CLEAR   Specific Gravity, Urine 1.018 1.005 - 1.030   pH 7.0 5.0 - 8.0   Glucose, UA NEGATIVE NEGATIVE mg/dL   Hgb urine dipstick SMALL (A) NEGATIVE   Bilirubin Urine NEGATIVE NEGATIVE   Ketones, ur NEGATIVE NEGATIVE mg/dL   Protein, ur NEGATIVE NEGATIVE mg/dL   Nitrite NEGATIVE NEGATIVE   Leukocytes, UA TRACE (A) NEGATIVE   RBC / HPF 0-5 0 - 5 RBC/hpf   WBC, UA 0-5 0 - 5 WBC/hpf   Bacteria, UA FEW (A) NONE SEEN   Squamous Epithelial / LPF 6-10 0 - 5   Mucus PRESENT    Amorphous Crystal PRESENT     IMAGING US Ob Comp Less 14 Wks  Result Date: 08/05/2018 CLINICAL DATA:  Bleeding in first trimester of pregnancy; LMP 06/15/2018 with EGA by LMP 7 weeks 2 days EXAM: OBSTETRIC <14 WK ULTRASOUND TECHNIQUE: Transabdominal ultrasound was performed for evaluation of the gestation as well as the maternal uterus and adnexal regions. Transvaginal imaging was not performed. COMPARISON:  None for this gestation FINDINGS: Intrauterine gestational sac: Present, single Yolk  sac:  Present Embryo:  Present Cardiac Activity: Present Heart Rate: 175 bpm CRL:   10.9 mm   7 w 1 d                  [place] EDC: 03/23/2019 Subchorionic hemorrhage:  None identified Maternal uterus/adnexae: RIGHT ovary normal size and  morphology, 2.9 x 1.9 x 1.7 cm. LEFT ovary normal size and morphology, 2.7 x 3.0 x 2.6 cm, containing a small corpus luteal cyst. No free pelvic fluid or adnexal masses. IMPRESSION: Single live intrauterine gestation at 7 weeks 1 day EGA by crown-rump length. No acute abnormalities. Electronically Signed   By: Ulyses SouthwardMark  Boles M.D.   On: 08/05/2018 17:48    MAU COURSE Orders Placed This Encounter  Procedures  . US OB Comp Less 14 Wks  . Urinalysis, Routine w reflex microscopic  . Pregnancy, urine POC   Meds ordered this encounter  Medications  . lactated ringers bolus 500 mL  . ondansetron (ZOFRAN) injection 4 mg  . metoCLOPramide (REGLAN) injection 10 mg    MDM UA, US, IV fluids ASSESSMENT 1. Bleeding in early pregnancy   2.  Nausea and vomiting  PLAN Small frequent meals.  Prescriptions for Zofran given. Discharge home in stable condition. Bleeding precautions  Zofran Zantac   Kenney Housemanrothero, Nancy Jean, CNM 08/05/2018  6:18 PM

## 2018-08-24 DIAGNOSIS — K210 Gastro-esophageal reflux disease with esophagitis: Secondary | ICD-10-CM | POA: Diagnosis not present

## 2018-08-24 DIAGNOSIS — R112 Nausea with vomiting, unspecified: Secondary | ICD-10-CM | POA: Diagnosis not present

## 2018-08-24 DIAGNOSIS — D071 Carcinoma in situ of vulva: Secondary | ICD-10-CM | POA: Diagnosis not present

## 2018-08-24 DIAGNOSIS — O09511 Supervision of elderly primigravida, first trimester: Secondary | ICD-10-CM | POA: Diagnosis not present

## 2018-08-24 DIAGNOSIS — N925 Other specified irregular menstruation: Secondary | ICD-10-CM | POA: Diagnosis not present

## 2018-08-24 LAB — OB RESULTS CONSOLE ABO/RH: RH Type: POSITIVE

## 2018-08-24 LAB — OB RESULTS CONSOLE RPR: RPR: NONREACTIVE

## 2018-08-24 LAB — OB RESULTS CONSOLE GC/CHLAMYDIA
CHLAMYDIA, DNA PROBE: NEGATIVE
Gonorrhea: NEGATIVE

## 2018-08-24 LAB — OB RESULTS CONSOLE ANTIBODY SCREEN: Antibody Screen: NEGATIVE

## 2018-08-24 LAB — OB RESULTS CONSOLE HEPATITIS B SURFACE ANTIGEN: HEP B S AG: NEGATIVE

## 2018-08-24 LAB — OB RESULTS CONSOLE RUBELLA ANTIBODY, IGM: RUBELLA: IMMUNE

## 2018-08-24 LAB — OB RESULTS CONSOLE HIV ANTIBODY (ROUTINE TESTING): HIV: NONREACTIVE

## 2018-08-25 DIAGNOSIS — Z23 Encounter for immunization: Secondary | ICD-10-CM | POA: Diagnosis not present

## 2018-08-25 DIAGNOSIS — Z0000 Encounter for general adult medical examination without abnormal findings: Secondary | ICD-10-CM | POA: Diagnosis not present

## 2018-08-25 DIAGNOSIS — E559 Vitamin D deficiency, unspecified: Secondary | ICD-10-CM | POA: Diagnosis not present

## 2018-08-25 DIAGNOSIS — Z136 Encounter for screening for cardiovascular disorders: Secondary | ICD-10-CM | POA: Diagnosis not present

## 2018-09-10 DIAGNOSIS — Z3401 Encounter for supervision of normal first pregnancy, first trimester: Secondary | ICD-10-CM | POA: Diagnosis not present

## 2018-09-10 DIAGNOSIS — Z3A13 13 weeks gestation of pregnancy: Secondary | ICD-10-CM | POA: Diagnosis not present

## 2018-09-16 DIAGNOSIS — Z3A14 14 weeks gestation of pregnancy: Secondary | ICD-10-CM | POA: Diagnosis not present

## 2018-09-16 DIAGNOSIS — Z3402 Encounter for supervision of normal first pregnancy, second trimester: Secondary | ICD-10-CM | POA: Diagnosis not present

## 2018-10-01 DIAGNOSIS — Z3482 Encounter for supervision of other normal pregnancy, second trimester: Secondary | ICD-10-CM | POA: Diagnosis not present

## 2018-10-13 DIAGNOSIS — Z3A18 18 weeks gestation of pregnancy: Secondary | ICD-10-CM | POA: Diagnosis not present

## 2018-10-13 DIAGNOSIS — Z369 Encounter for antenatal screening, unspecified: Secondary | ICD-10-CM | POA: Diagnosis not present

## 2018-10-27 DIAGNOSIS — Z369 Encounter for antenatal screening, unspecified: Secondary | ICD-10-CM | POA: Diagnosis not present

## 2018-10-27 DIAGNOSIS — Z3402 Encounter for supervision of normal first pregnancy, second trimester: Secondary | ICD-10-CM | POA: Diagnosis not present

## 2018-10-27 DIAGNOSIS — Z3A20 20 weeks gestation of pregnancy: Secondary | ICD-10-CM | POA: Diagnosis not present

## 2018-11-25 DIAGNOSIS — Z3402 Encounter for supervision of normal first pregnancy, second trimester: Secondary | ICD-10-CM | POA: Diagnosis not present

## 2018-11-25 DIAGNOSIS — Z3A24 24 weeks gestation of pregnancy: Secondary | ICD-10-CM | POA: Diagnosis not present

## 2018-11-25 DIAGNOSIS — Z362 Encounter for other antenatal screening follow-up: Secondary | ICD-10-CM | POA: Diagnosis not present

## 2018-12-15 NOTE — L&D Delivery Note (Signed)
Delivery Note At  a viable female was delivered via  (Presentation:OA ;  ).  APGAR: , ; weight    pending.   Placenta status: complete, . 3V Cord:  with the following complications:nuchal cord x 1 loose and compound hand presentation .    Anesthesia:  Epidural Episiotomy:  None Lacerations:  1st degree periurtheral Suture Repair: 4-0 vicryl Est. Blood Loss (mL):100cc    Mom to postpartum.  Baby to Nursery.  Henderson Newcomer Lera Gaines 03/11/2019, 9:57 PM

## 2018-12-23 DIAGNOSIS — O403XX0 Polyhydramnios, third trimester, not applicable or unspecified: Secondary | ICD-10-CM | POA: Diagnosis not present

## 2018-12-23 DIAGNOSIS — Z369 Encounter for antenatal screening, unspecified: Secondary | ICD-10-CM | POA: Diagnosis not present

## 2018-12-23 DIAGNOSIS — Z23 Encounter for immunization: Secondary | ICD-10-CM | POA: Diagnosis not present

## 2018-12-23 DIAGNOSIS — Z3A28 28 weeks gestation of pregnancy: Secondary | ICD-10-CM | POA: Diagnosis not present

## 2019-01-06 ENCOUNTER — Other Ambulatory Visit (HOSPITAL_COMMUNITY): Payer: Self-pay | Admitting: Obstetrics and Gynecology

## 2019-01-06 DIAGNOSIS — R809 Proteinuria, unspecified: Secondary | ICD-10-CM | POA: Diagnosis not present

## 2019-01-06 DIAGNOSIS — Z3A30 30 weeks gestation of pregnancy: Secondary | ICD-10-CM | POA: Diagnosis not present

## 2019-01-06 DIAGNOSIS — O403XX0 Polyhydramnios, third trimester, not applicable or unspecified: Secondary | ICD-10-CM | POA: Diagnosis not present

## 2019-01-06 DIAGNOSIS — O99810 Abnormal glucose complicating pregnancy: Secondary | ICD-10-CM | POA: Diagnosis not present

## 2019-01-10 ENCOUNTER — Encounter (HOSPITAL_COMMUNITY): Payer: Self-pay

## 2019-01-10 ENCOUNTER — Ambulatory Visit (HOSPITAL_COMMUNITY)
Admission: RE | Admit: 2019-01-10 | Discharge: 2019-01-10 | Disposition: A | Discharge status: Home / Self Care | Payer: BLUE CROSS/BLUE SHIELD | Source: Ambulatory Visit | Attending: Obstetrics and Gynecology | Admitting: Obstetrics and Gynecology

## 2019-01-10 DIAGNOSIS — Z3A21 21 weeks gestation of pregnancy: Secondary | ICD-10-CM | POA: Diagnosis not present

## 2019-01-10 DIAGNOSIS — O99213 Obesity complicating pregnancy, third trimester: Secondary | ICD-10-CM

## 2019-01-10 DIAGNOSIS — O09523 Supervision of elderly multigravida, third trimester: Secondary | ICD-10-CM

## 2019-01-10 DIAGNOSIS — O403XX0 Polyhydramnios, third trimester, not applicable or unspecified: Secondary | ICD-10-CM | POA: Diagnosis not present

## 2019-01-10 DIAGNOSIS — Z3A31 31 weeks gestation of pregnancy: Secondary | ICD-10-CM | POA: Diagnosis not present

## 2019-01-17 ENCOUNTER — Encounter: Payer: BLUE CROSS/BLUE SHIELD | Attending: Obstetrics and Gynecology | Admitting: Skilled Nursing Facility1

## 2019-01-17 ENCOUNTER — Encounter: Payer: Self-pay | Admitting: Skilled Nursing Facility1

## 2019-01-17 DIAGNOSIS — O403XX0 Polyhydramnios, third trimester, not applicable or unspecified: Secondary | ICD-10-CM | POA: Diagnosis not present

## 2019-01-17 DIAGNOSIS — O99810 Abnormal glucose complicating pregnancy: Secondary | ICD-10-CM | POA: Diagnosis not present

## 2019-01-17 DIAGNOSIS — Z3A32 32 weeks gestation of pregnancy: Secondary | ICD-10-CM | POA: Diagnosis not present

## 2019-01-17 DIAGNOSIS — O403XX1 Polyhydramnios, third trimester, fetus 1: Secondary | ICD-10-CM | POA: Diagnosis not present

## 2019-01-17 DIAGNOSIS — O24419 Gestational diabetes mellitus in pregnancy, unspecified control: Secondary | ICD-10-CM | POA: Insufficient documentation

## 2019-01-17 DIAGNOSIS — E162 Hypoglycemia, unspecified: Secondary | ICD-10-CM | POA: Diagnosis not present

## 2019-01-17 NOTE — Progress Notes (Addendum)
Pt arrived stating her physician wanted her blood sugar to be tested due to a reading of 54 in her office; Pts number 101. Pt states in the morning she has been feeling dizzy and overheated.  Apples and peanut butter and gingerale 11am and then tested at 12:30 with a blood sugar of 54 stating she had not felt good all morning: hot  Pt states she has been vomiting every morning. Pt states she eats about 2 hours after waking stating weather she eats sooner or not she still gets sick hours later.  Pt states her sickness has improved since her first trimester.  Pt states she is [redacted] weeks along in her pregnancy.  Pt states she has been working on eating better and wants to eat less fast food.  Pt states she dos struggle with her daughter due to her daughters ADD and anxiety stating she too has anxiety.  Pt states she tried eating crackers before getting out of bed but it did not help with her sickness.  Pt states she was not diagnosed with GDM stating her doctor wants to wait and see what her blood sugars do.  Pt was given: contour next log: [id number] exp: 01/15/20 and advised to call her insurance company to ensure they cover it and then her physician for a prescription   24 hour recall:  Fast food bagel with bacon egg cheese or parfait  Cheese and crackers or grapes and cheese Salad: cucumber tomato carrots lettuces or chic fila fruit in salad or vegetable sup with grapes  Steak or chicken corn or asparagus or mashed potatoes or fast food  Beverage: 2 cans of soda, water, unsweet tea  Pt Education: Definition of reactive hypoglycemia and how to avoid it Signs and symptoms of low blood sugar Treatment of low blood sugar Proper checking of her blood sugar along with how often Appropriate blood sugar numbers  What carb consistency is, how it helps with blood sugar, and meal examples   Goals: Aim for 80 ounces of fluid including 32 ounces of plain water each day Do not drink any regular  soda; switch to diet soda limiting yourself to 2 cans a day Aim for 2-3 carb choices per meal and 1 per snack completely avoiding simple sugars eating about 21+ grams of protein per meal and about 7 grams of protein with each meal Grocery shop every 2 weeks and avoid eating out Test your blood sugar 4 times a day: fasting and 2 hours after each meal: log every number and how you felt at that number Do pregnancy yoga on youtube 3 times a week

## 2019-01-24 DIAGNOSIS — O403XX0 Polyhydramnios, third trimester, not applicable or unspecified: Secondary | ICD-10-CM | POA: Diagnosis not present

## 2019-01-24 DIAGNOSIS — Z3A33 33 weeks gestation of pregnancy: Secondary | ICD-10-CM | POA: Diagnosis not present

## 2019-01-24 DIAGNOSIS — Z3493 Encounter for supervision of normal pregnancy, unspecified, third trimester: Secondary | ICD-10-CM | POA: Diagnosis not present

## 2019-02-01 DIAGNOSIS — Z3A34 34 weeks gestation of pregnancy: Secondary | ICD-10-CM | POA: Diagnosis not present

## 2019-02-01 DIAGNOSIS — O403XX0 Polyhydramnios, third trimester, not applicable or unspecified: Secondary | ICD-10-CM | POA: Diagnosis not present

## 2019-02-07 DIAGNOSIS — J029 Acute pharyngitis, unspecified: Secondary | ICD-10-CM | POA: Diagnosis not present

## 2019-02-09 DIAGNOSIS — Z3A35 35 weeks gestation of pregnancy: Secondary | ICD-10-CM | POA: Diagnosis not present

## 2019-02-09 DIAGNOSIS — Z369 Encounter for antenatal screening, unspecified: Secondary | ICD-10-CM | POA: Diagnosis not present

## 2019-02-09 DIAGNOSIS — O403XX1 Polyhydramnios, third trimester, fetus 1: Secondary | ICD-10-CM | POA: Diagnosis not present

## 2019-02-09 LAB — OB RESULTS CONSOLE GBS: GBS: NEGATIVE

## 2019-02-16 DIAGNOSIS — R030 Elevated blood-pressure reading, without diagnosis of hypertension: Secondary | ICD-10-CM | POA: Diagnosis not present

## 2019-02-16 DIAGNOSIS — Z3A36 36 weeks gestation of pregnancy: Secondary | ICD-10-CM | POA: Diagnosis not present

## 2019-02-16 DIAGNOSIS — O99213 Obesity complicating pregnancy, third trimester: Secondary | ICD-10-CM | POA: Diagnosis not present

## 2019-02-21 DIAGNOSIS — O99810 Abnormal glucose complicating pregnancy: Secondary | ICD-10-CM | POA: Diagnosis not present

## 2019-02-21 DIAGNOSIS — O403XX1 Polyhydramnios, third trimester, fetus 1: Secondary | ICD-10-CM | POA: Diagnosis not present

## 2019-02-21 DIAGNOSIS — O1213 Gestational proteinuria, third trimester: Secondary | ICD-10-CM | POA: Diagnosis not present

## 2019-02-21 DIAGNOSIS — Z3A37 37 weeks gestation of pregnancy: Secondary | ICD-10-CM | POA: Diagnosis not present

## 2019-02-21 DIAGNOSIS — O403XX0 Polyhydramnios, third trimester, not applicable or unspecified: Secondary | ICD-10-CM | POA: Diagnosis not present

## 2019-02-23 ENCOUNTER — Telehealth (HOSPITAL_COMMUNITY): Payer: Self-pay | Admitting: *Deleted

## 2019-02-24 DIAGNOSIS — Z3A37 37 weeks gestation of pregnancy: Secondary | ICD-10-CM | POA: Diagnosis not present

## 2019-02-24 DIAGNOSIS — Z3403 Encounter for supervision of normal first pregnancy, third trimester: Secondary | ICD-10-CM | POA: Diagnosis not present

## 2019-02-24 DIAGNOSIS — O1213 Gestational proteinuria, third trimester: Secondary | ICD-10-CM | POA: Diagnosis not present

## 2019-02-28 DIAGNOSIS — O1213 Gestational proteinuria, third trimester: Secondary | ICD-10-CM | POA: Diagnosis not present

## 2019-02-28 DIAGNOSIS — O403XX1 Polyhydramnios, third trimester, fetus 1: Secondary | ICD-10-CM | POA: Diagnosis not present

## 2019-02-28 DIAGNOSIS — Z3A38 38 weeks gestation of pregnancy: Secondary | ICD-10-CM | POA: Diagnosis not present

## 2019-03-02 ENCOUNTER — Encounter (HOSPITAL_COMMUNITY): Payer: Self-pay | Admitting: *Deleted

## 2019-03-02 NOTE — Telephone Encounter (Signed)
Preadmission screen  

## 2019-03-03 ENCOUNTER — Other Ambulatory Visit: Payer: Self-pay | Admitting: Obstetrics and Gynecology

## 2019-03-03 DIAGNOSIS — O139 Gestational [pregnancy-induced] hypertension without significant proteinuria, unspecified trimester: Secondary | ICD-10-CM | POA: Diagnosis not present

## 2019-03-07 ENCOUNTER — Inpatient Hospital Stay (HOSPITAL_COMMUNITY): Admission: RE | Admit: 2019-03-07 | Payer: BLUE CROSS/BLUE SHIELD | Source: Ambulatory Visit

## 2019-03-09 DIAGNOSIS — O403XX1 Polyhydramnios, third trimester, fetus 1: Secondary | ICD-10-CM | POA: Diagnosis not present

## 2019-03-09 DIAGNOSIS — O09899 Supervision of other high risk pregnancies, unspecified trimester: Secondary | ICD-10-CM | POA: Diagnosis not present

## 2019-03-09 DIAGNOSIS — Z3A39 39 weeks gestation of pregnancy: Secondary | ICD-10-CM | POA: Diagnosis not present

## 2019-03-09 DIAGNOSIS — O1213 Gestational proteinuria, third trimester: Secondary | ICD-10-CM | POA: Diagnosis not present

## 2019-03-11 ENCOUNTER — Encounter (HOSPITAL_COMMUNITY): Payer: Self-pay

## 2019-03-11 ENCOUNTER — Inpatient Hospital Stay (HOSPITAL_COMMUNITY): Payer: BLUE CROSS/BLUE SHIELD | Admitting: Anesthesiology

## 2019-03-11 ENCOUNTER — Other Ambulatory Visit: Payer: Self-pay

## 2019-03-11 ENCOUNTER — Inpatient Hospital Stay (HOSPITAL_COMMUNITY)
Admission: AD | Admit: 2019-03-11 | Discharge: 2019-03-13 | DRG: 807 | Disposition: A | Discharge status: Home / Self Care | Payer: BLUE CROSS/BLUE SHIELD | Attending: Obstetrics & Gynecology | Admitting: Obstetrics & Gynecology

## 2019-03-11 ENCOUNTER — Inpatient Hospital Stay (HOSPITAL_COMMUNITY): Payer: BLUE CROSS/BLUE SHIELD

## 2019-03-11 DIAGNOSIS — D649 Anemia, unspecified: Secondary | ICD-10-CM | POA: Diagnosis not present

## 2019-03-11 DIAGNOSIS — F329 Major depressive disorder, single episode, unspecified: Secondary | ICD-10-CM | POA: Diagnosis not present

## 2019-03-11 DIAGNOSIS — Z3A39 39 weeks gestation of pregnancy: Secondary | ICD-10-CM | POA: Diagnosis not present

## 2019-03-11 DIAGNOSIS — O1494 Unspecified pre-eclampsia, complicating childbirth: Secondary | ICD-10-CM | POA: Diagnosis not present

## 2019-03-11 DIAGNOSIS — F419 Anxiety disorder, unspecified: Secondary | ICD-10-CM

## 2019-03-11 DIAGNOSIS — O322XX0 Maternal care for transverse and oblique lie, not applicable or unspecified: Secondary | ICD-10-CM | POA: Diagnosis present

## 2019-03-11 DIAGNOSIS — O99214 Obesity complicating childbirth: Secondary | ICD-10-CM | POA: Diagnosis present

## 2019-03-11 DIAGNOSIS — O99344 Other mental disorders complicating childbirth: Secondary | ICD-10-CM | POA: Diagnosis present

## 2019-03-11 DIAGNOSIS — O403XX0 Polyhydramnios, third trimester, not applicable or unspecified: Principal | ICD-10-CM | POA: Diagnosis present

## 2019-03-11 DIAGNOSIS — J45909 Unspecified asthma, uncomplicated: Secondary | ICD-10-CM | POA: Diagnosis present

## 2019-03-11 DIAGNOSIS — O9902 Anemia complicating childbirth: Secondary | ICD-10-CM | POA: Diagnosis present

## 2019-03-11 DIAGNOSIS — O9952 Diseases of the respiratory system complicating childbirth: Secondary | ICD-10-CM | POA: Diagnosis present

## 2019-03-11 DIAGNOSIS — O409XX0 Polyhydramnios, unspecified trimester, not applicable or unspecified: Secondary | ICD-10-CM

## 2019-03-11 DIAGNOSIS — O1213 Gestational proteinuria, third trimester: Secondary | ICD-10-CM | POA: Diagnosis not present

## 2019-03-11 DIAGNOSIS — O09529 Supervision of elderly multigravida, unspecified trimester: Secondary | ICD-10-CM

## 2019-03-11 DIAGNOSIS — E66813 Obesity, class 3: Secondary | ICD-10-CM

## 2019-03-11 LAB — COMPREHENSIVE METABOLIC PANEL
ALT: 17 U/L (ref 0–44)
AST: 21 U/L (ref 15–41)
Albumin: 2.8 g/dL — ABNORMAL LOW (ref 3.5–5.0)
Alkaline Phosphatase: 112 U/L (ref 38–126)
Anion gap: 11 (ref 5–15)
BUN: 5 mg/dL — ABNORMAL LOW (ref 6–20)
CHLORIDE: 104 mmol/L (ref 98–111)
CO2: 20 mmol/L — ABNORMAL LOW (ref 22–32)
Calcium: 8.8 mg/dL — ABNORMAL LOW (ref 8.9–10.3)
Creatinine, Ser: 0.53 mg/dL (ref 0.44–1.00)
GFR calc Af Amer: >60 mL/min (ref >60)
GFR calc non Af Amer: >60 mL/min (ref >60)
Glucose, Bld: 98 mg/dL (ref 70–99)
POTASSIUM: 3.9 mmol/L (ref 3.5–5.1)
Sodium: 135 mmol/L (ref 135–145)
Total Bilirubin: 0.4 mg/dL (ref 0.3–1.2)
Total Protein: 6.2 g/dL — ABNORMAL LOW (ref 6.5–8.1)

## 2019-03-11 LAB — CBC
HEMATOCRIT: 35.7 % — AB (ref 36.0–46.0)
Hemoglobin: 12.1 g/dL (ref 12.0–15.0)
MCH: 29.3 pg (ref 26.0–34.0)
MCHC: 33.9 g/dL (ref 30.0–36.0)
MCV: 86.4 fL (ref 80.0–100.0)
Platelets: 240 10*3/uL (ref 150–400)
RBC: 4.13 MIL/uL (ref 3.87–5.11)
RDW: 15.1 % (ref 11.5–15.5)
WBC: 9.7 10*3/uL (ref 4.0–10.5)
nRBC: 0 % (ref 0.0–0.2)

## 2019-03-11 LAB — TYPE AND SCREEN
ABO/RH(D): A POS
Antibody Screen: NEGATIVE

## 2019-03-11 LAB — PROTEIN / CREATININE RATIO, URINE
Creatinine, Urine: 127.27 mg/dL
PROTEIN CREATININE RATIO: 0.16 mg/mg{creat} — AB (ref 0.00–0.15)
Total Protein, Urine: 20 mg/dL

## 2019-03-11 LAB — ABO/RH: ABO/RH(D): A POS

## 2019-03-11 LAB — RPR: RPR Ser Ql: NONREACTIVE

## 2019-03-11 MED ORDER — FENTANYL CITRATE (PF) 100 MCG/2ML IJ SOLN
50.0000 ug | INTRAMUSCULAR | Status: DC | PRN
  Administered 2019-03-11 (×3): 100 ug via INTRAVENOUS
  Filled 2019-03-11 (×3): qty 2

## 2019-03-11 MED ORDER — SODIUM CHLORIDE (PF) 0.9 % IJ SOLN
INTRAMUSCULAR | Status: DC | PRN
  Administered 2019-03-11: 12 mL/h via EPIDURAL

## 2019-03-11 MED ORDER — DIPHENHYDRAMINE HCL 50 MG/ML IJ SOLN: 12.5000 mg | INTRAMUSCULAR | Status: DC | PRN

## 2019-03-11 MED ORDER — EPHEDRINE 5 MG/ML INJ: 10.0000 mg | INTRAVENOUS | Status: DC | PRN

## 2019-03-11 MED ORDER — PANTOPRAZOLE SODIUM 40 MG IV SOLR
40.0000 mg | Freq: Two times a day (BID) | INTRAVENOUS | Status: DC
  Administered 2019-03-11: 40 mg via INTRAVENOUS
  Filled 2019-03-11: qty 40

## 2019-03-11 MED ORDER — TERBUTALINE SULFATE 1 MG/ML IJ SOLN: 0.2500 mg | Freq: Once | INTRAMUSCULAR | Status: DC | PRN

## 2019-03-11 MED ORDER — FENTANYL-BUPIVACAINE-NACL 0.5-0.125-0.9 MG/250ML-% EP SOLN
12.0000 mL/h | EPIDURAL | Status: DC | PRN
  Filled 2019-03-11: qty 250

## 2019-03-11 MED ORDER — PHENYLEPHRINE 40 MCG/ML (10ML) SYRINGE FOR IV PUSH (FOR BLOOD PRESSURE SUPPORT)
80.0000 ug | PREFILLED_SYRINGE | INTRAVENOUS | Status: DC | PRN
  Administered 2019-03-11 (×2): 80 ug via INTRAVENOUS
  Filled 2019-03-11 (×2): qty 10

## 2019-03-11 MED ORDER — OXYTOCIN BOLUS FROM INFUSION: 500.0000 mL | Freq: Once | INTRAVENOUS | Status: DC

## 2019-03-11 MED ORDER — SERTRALINE HCL 100 MG PO TABS
100.0000 mg | ORAL_TABLET | Freq: Every day | ORAL | Status: DC
  Administered 2019-03-11: 100 mg via ORAL
  Filled 2019-03-11 (×2): qty 1

## 2019-03-11 MED ORDER — OXYTOCIN 40 UNITS IN NORMAL SALINE INFUSION - SIMPLE MED: 1.0000 m[IU]/min | INTRAVENOUS | Status: DC

## 2019-03-11 MED ORDER — OXYTOCIN 40 UNITS IN NORMAL SALINE INFUSION - SIMPLE MED: 2.5000 [IU]/h | INTRAVENOUS | Status: DC

## 2019-03-11 MED ORDER — FLEET ENEMA 7-19 GM/118ML RE ENEM: 1.0000 | ENEMA | RECTAL | Status: DC | PRN

## 2019-03-11 MED ORDER — SOD CITRATE-CITRIC ACID 500-334 MG/5ML PO SOLN
30.0000 mL | ORAL | Status: DC | PRN
  Administered 2019-03-11 (×2): 30 mL via ORAL
  Filled 2019-03-11 (×2): qty 15

## 2019-03-11 MED ORDER — EPHEDRINE 5 MG/ML INJ
10.0000 mg | INTRAVENOUS | Status: DC | PRN
  Administered 2019-03-11: 10 mg via INTRAVENOUS
  Filled 2019-03-11: qty 10

## 2019-03-11 MED ORDER — MISOPROSTOL 25 MCG QUARTER TABLET
ORAL_TABLET | ORAL | Status: AC
  Filled 2019-03-11: qty 1

## 2019-03-11 MED ORDER — PHENYLEPHRINE 40 MCG/ML (10ML) SYRINGE FOR IV PUSH (FOR BLOOD PRESSURE SUPPORT)
80.0000 ug | PREFILLED_SYRINGE | INTRAVENOUS | Status: AC | PRN
  Administered 2019-03-11 (×3): 80 ug via INTRAVENOUS

## 2019-03-11 MED ORDER — LACTATED RINGERS IV SOLN
500.0000 mL | Freq: Once | INTRAVENOUS | Status: AC
  Administered 2019-03-11: 500 mL via INTRAVENOUS

## 2019-03-11 MED ORDER — MISOPROSTOL 25 MCG QUARTER TABLET
25.0000 ug | ORAL_TABLET | ORAL | Status: DC | PRN
  Administered 2019-03-11: 25 ug via VAGINAL
  Filled 2019-03-11: qty 1

## 2019-03-11 MED ORDER — ONDANSETRON HCL 4 MG/2ML IJ SOLN
4.0000 mg | Freq: Four times a day (QID) | INTRAMUSCULAR | Status: DC | PRN
  Administered 2019-03-11 (×2): 4 mg via INTRAVENOUS
  Filled 2019-03-11 (×2): qty 2

## 2019-03-11 MED ORDER — LACTATED RINGERS IV SOLN
500.0000 mL | INTRAVENOUS | Status: DC | PRN
  Administered 2019-03-11 (×2): 500 mL via INTRAVENOUS

## 2019-03-11 MED ORDER — ACETAMINOPHEN 325 MG PO TABS: 650.0000 mg | ORAL_TABLET | ORAL | Status: DC | PRN

## 2019-03-11 MED ORDER — OXYTOCIN 40 UNITS IN NORMAL SALINE INFUSION - SIMPLE MED
1.0000 m[IU]/min | INTRAVENOUS | Status: DC
  Administered 2019-03-11: 2 m[IU]/min via INTRAVENOUS
  Filled 2019-03-11: qty 1000

## 2019-03-11 MED ORDER — LIDOCAINE HCL (PF) 1 % IJ SOLN: 30.0000 mL | INTRAMUSCULAR | Status: DC | PRN

## 2019-03-11 MED ORDER — LIDOCAINE HCL (PF) 1 % IJ SOLN
INTRAMUSCULAR | Status: DC | PRN
  Administered 2019-03-11: 11 mL via EPIDURAL

## 2019-03-11 MED ORDER — LACTATED RINGERS IV SOLN
INTRAVENOUS | Status: DC
  Administered 2019-03-11 (×3): via INTRAVENOUS

## 2019-03-11 NOTE — Progress Notes (Signed)
Kristi Wu is a 38 y.o. G3P1011 at [redacted]w[redacted]d admitted for induction of labor due to gestational proteinuria, gestational hypertension, and mild polyhydramnios.  Subjective: Patient comfortable after epidural but having some nausea due to hypotension. Phenylepherine given and patient feeling better. On exam, fetal head more well applied to cervix but still very posterior which would make AROM challenging. Patient to begin pitocin as contractions have spaced out. Will consider AROM in future if indicated.  Objective: Vitals:   03/11/19 1319 03/11/19 1322 03/11/19 1331 03/11/19 1346  BP: 132/84 129/71 116/78 122/85  Pulse: 89 90 91 85  Resp: 14 16 15    Temp:      TempSrc:      Weight:      Height:       FHT:  FHR: 150s/160s bpm, variability: minimal ,  accelerations:  Present,  decelerations:  Absent UC:  Regular every 2-23min  SVE:   Dilation: 5 Effacement (%): 50 Station: -2 Exam by:: Ethelene Browns, CNM  Labs: Lab Results  Component Value Date   WBC 9.7 03/11/2019   HGB 12.1 03/11/2019   HCT 35.7 (L) 03/11/2019   MCV 86.4 03/11/2019   PLT 240 03/11/2019    Assessment / Plan: Induction of labor due to gestational hypertension,  progressing well on pitocin  Labor: Progressing normally Preeclampsia:  no signs or symptoms of toxicity, intake and ouput balanced and labs stable Fetal Wellbeing:  Category I Pain Control:  IV pain meds I/D:  n/a Anticipated MOD:  NSVD  Kristi Wu 03/11/2019, 1:49 PM

## 2019-03-11 NOTE — Progress Notes (Addendum)
Kristi Wu is a 38 y.o. G3P1011 at [redacted]w[redacted]d admitted for induction of labor due to gestational proteinuria, gestational hypertension, and mild polyhydramnios.  Subjective: Patient reporting moderate intensity contractions and has taken Fentanyl for pain x2. Eventually plans for epidural. Discussed inserting cooks catheter with patient and she consented. Cooks placed with internal balloon inflated.   Objective: Vitals:   03/11/19 0601 03/11/19 0701 03/11/19 0800 03/11/19 0858  BP: 130/74 129/84 131/83 123/69  Pulse: 91 89 91 67  Resp: 16 16 18 18   Temp:   98.6 F (37 C)   TempSrc:   Oral   Weight:      Height:       FHT:  FHR: 130s/140s bpm, variability: moderate,  accelerations:  Present,  decelerations:  Absent UC:  regular, every 1.5-2.5 minutes SVE:   Dilation: 1.5 Effacement (%): 60 Station: -3 Exam by:: Ethelene Browns, CNM  Labs: Lab Results  Component Value Date   WBC 9.7 03/11/2019   HGB 12.1 03/11/2019   HCT 35.7 (L) 03/11/2019   MCV 86.4 03/11/2019   PLT 240 03/11/2019    Assessment / Plan: Induction of labor progressing with cytotec  Labor: Progressing normally Preeclampsia:  no signs or symptoms of toxicity, intake and ouput balanced and labs stable Fetal Wellbeing:  Category I Pain Control:  IV pain meds I/D:  n/a Anticipated MOD:  NSVD  [name] 03/11/2019, 9:24 AM   Foley bulb fell out. Patient continuing to have regular strong contractions. She desires epidural now. Fetal head ballotable, will reassess later to determine if AROM is safe and indicated.   Dilation: 5 Effacement (%): 50 Station: Ballotable Presentation: Vertex Exam by:: Ethelene Browns, CNM  [name] 03/11/19 11:21 AM

## 2019-03-11 NOTE — Progress Notes (Signed)
Kristi Wu is a 38 y.o. G3P1011 at [redacted]w[redacted]d admitted for induction of labor due to gestational proteinuria, gestational hypertension, and mild polyhydramnios.  Subjective: Patient comfortable after epidural. On exam fetal station high but fetal head well applied. Dr. Redmond Baseman patient clear and placed IUPC.   Objective: Vitals:   03/11/19 1500 03/11/19 1531 03/11/19 1601 03/11/19 1631  BP: 123/75 119/81 117/75 120/77  Pulse: 80 99 81 92  Resp: 15 16 17 16   Temp:      TempSrc:      Weight:      Height:       FHT:  FHR: 170s bpm, variability: moderate,  accelerations:  Present,  decelerations:  Present single late decel noted at 1746 UC:  Regular every 2-60min  SVE:   Dilation: 5 Effacement (%): 50 Station: -2 Exam by:: Dr. Mora Appl  Labs: Lab Results  Component Value Date   WBC 9.7 03/11/2019   HGB 12.1 03/11/2019   HCT 35.7 (L) 03/11/2019   MCV 86.4 03/11/2019   PLT 240 03/11/2019    Assessment / Plan: Induction of labor due to gestational hypertension,  progressing well on pitocin. Dr. Mora Appl made aware of fetal tachycardia and has reviewed tracing.   Labor: Progressing normally Preeclampsia:  no signs or symptoms of toxicity, intake and ouput balanced and labs stable Fetal Wellbeing:  Category II but overall reassuring  Pain Control:  IV pain meds I/D:  n/a Anticipated MOD:  NSVD  Kristi Wu 03/11/2019, 5:39 PM

## 2019-03-11 NOTE — Anesthesia Procedure Notes (Signed)
Epidural Patient location during procedure: OB Start time: 03/11/2019 11:52 AM End time: 03/11/2019 12:05 PM  Staffing Anesthesiologist: Lowella Curb, MD Performed: anesthesiologist   Preanesthetic Checklist Completed: patient identified, site marked, surgical consent, pre-op evaluation, timeout performed, IV checked, risks and benefits discussed and monitors and equipment checked  Epidural Patient position: sitting Prep: ChloraPrep Patient monitoring: heart rate, cardiac monitor, continuous pulse ox and blood pressure Approach: midline Location: L2-L3 Injection technique: LOR saline  Needle:  Needle type: Tuohy  Needle gauge: 17 G Needle length: 9 cm Needle insertion depth: 5 cm Catheter type: closed end flexible Catheter size: 20 Guage Catheter at skin depth: 10 cm Test dose: negative  Assessment Events: blood not aspirated, injection not painful, no injection resistance, negative IV test and no paresthesia  Additional Notes Reason for block:procedure for pain

## 2019-03-11 NOTE — H&P (Signed)
Kristi Wu is a 38 y.o. female, G3P1011 at 39+4 weeks, presenting for induction of labor. Pregnancy was complicated by anxiety well controlled by sertraline, asthma with use of albuterol inhaler, anemia, mild polyhydramnios  with most recent AFI 25 on 03/09/19 , and impaired glucose tolerance (elevated 1h glucola, did not tolerate 3h test,  A1C of 5.5, recent BS log shows fastings <90, post prandials <120). Ms. Lagasse is also morbidly obese and of advanced maternal age. She had proteinuria, PCR 0.87 at 31 weeks, then PCR 0.22 at 36 weeks. Blood work normal. Blood pressures elevated in office at on occasions, was not diagnosed with GHTN.  Patient Active Problem List   Diagnosis Date Noted  . Proteinuria affecting pregnancy in third trimester 03/11/2019  . Anxiety 03/11/2019  . Obesity, Class III, BMI 40-49.9 (morbid obesity) (HCC) 03/11/2019  . Advanced maternal age in multigravida 03/11/2019  . Polyhydramnios 03/11/2019  . Ectopic pregnancy   . Depression   . Asthma   . Headache(784.0)   . Low iron     History of present pregnancy: Patient entered care at 11 weeks.   EDC of 03/14/19 was established by 11 week [place].   Anatomy scan:  20 weeks, with normal findings and an anterior placenta.   Additional [place] evaluations: Weekly BPPs, growth scans r/t polyhydramnios. Most recent 3/25, vertex, AFI 25, BPP 8/8. Significant prenatal events:  MFM consult for poly    Last evaluation:  ROB 03/09/19  OB History    Gravida  3   Para  1   Term  1   Preterm      AB  1   Living  1     SAB      TAB      Ectopic  1   Multiple      Live Births  1          Past Medical History:  Diagnosis Date  . Anxiety   . Asthma    used 03/04/19  . Complication of anesthesia    asthma attack during endoscopy  . Depression   . Ectopic pregnancy   . Headache(784.0)   . History of hiatal hernia   . Hypertension   . Low iron   . Pregnancy induced hypertension    Past Surgical History:   Procedure Laterality Date  . COLPOSCOPY VULVA    . PLANTAR FASCIA SURGERY    . UPPER GI ENDOSCOPY    . WISDOM TOOTH EXTRACTION  2005   Family History: family history includes Asthma in her mother; Cancer in her maternal grandmother; Diabetes in her maternal grandfather and paternal grandfather; Emphysema in her maternal grandmother and paternal grandfather; Hypertension in her father; Kidney disease in her paternal grandfather; Thyroid disease in her father. Social History:  reports that she has never smoked. She has never used smokeless tobacco. She reports that she does not drink alcohol or use drugs.   Prenatal Transfer Tool  Maternal Diabetes: No Elevated 1h glucola, did not tolerate 3h test,  A1C of 5.5, recent BS log shows fastings <90, post prandials <120. Genetic Screening: Normal Normal AFP, insufficient fetal fraction on Panorama Maternal Ultrasounds/Referrals: Abnormal:  Findings:   Other: Polyhydramnios  Fetal Ultrasounds or other Referrals:  Referred to Materal Fetal Medicine  Maternal Substance Abuse:  No Significant Maternal Medications:  Meds include: Zoloft Significant Maternal Lab Results: Lab values include: Group B Strep negative  TDAP yes Flu yes  ROS:  All ten systems reviewed and negative,  except as noted. Denies VB, LOF.  Denies headache, epigastric pain, and visual sx. Reports good FM.   Allergies  Allergen Reactions  . Amoxicillin     unknown  . Doxycycline Hives  . Penicillins     unknown  . Sulfa Antibiotics     unknown     Dilation: Closed Effacement (%): 50 Station: -3 Exam by:: Okey Regal, CNM Blood pressure 123/82, pulse 85, temperature 98.2 F (36.8 C), temperature source Oral, resp. rate 16, height 5\' 2"  (1.575 m), weight 103.4 kg, last menstrual period 06/14/2018.   Chest clear Heart RRR without murmur Abd gravid, NT Pelvic: adequate Ext: No signs or symptoms of DVT  FHR: NICHD category 1, moderate variability, accels present, decels  absent. UCs:  Uterine irritability but pt not feeling any contractions or cramping  Prenatal labs: ABO, Rh: --/--/A POS, A POS Performed at [facility], [address] [place], [place] [address]  [phone] [id number]) Antibody: NEG (03/27 0058) Rubella:  Immune (09/10 0000) RPR: Nonreactive (09/10 0000)  HBsAg: Negative (09/10 0000)  HIV: Non-reactive (09/10 0000)  GBS: Negative (02/26 0000) Sickle cell/Hgb electrophoresis:  AA GC:  neg Chlamydia:  neg Genetic screenings:  Normal AFP, insufficient fetal fraction on Panorama Glucola:  167 Hgb 12.7 at NOB, 11.6 at 28 weeks  Vtx by [place] and by [place] on 3/25.   Assessment: 38 y.o., G3P1011 at 39+4 weeks IOL AMA obesity Polyhydramnios Impaired glucose tolerance Category 1 tracing  Plan: Admit to [facility]thing Suites Cervical ripening: misoprostol #1 placed at 0115 Routine [facility] orders  Desires epidural later in labor Anticipate SVD  Jonetta Speak, CNM 03/11/2019, 2:17 AM

## 2019-03-11 NOTE — Anesthesia Preprocedure Evaluation (Signed)
Anesthesia Evaluation  Patient identified by MRN, date of birth, ID band Patient awake    Reviewed: Allergy & Precautions, NPO status , Patient's Chart, lab work & pertinent test results  Airway Mallampati: II  TM Distance: >3 FB Neck ROM: Full    Dental no notable dental hx.    Pulmonary asthma ,    Pulmonary exam normal breath sounds clear to auscultation       Cardiovascular hypertension, negative cardio ROS Normal cardiovascular exam Rhythm:Regular Rate:Normal     Neuro/Psych  Headaches, Anxiety Depression negative psych ROS   GI/Hepatic negative GI ROS, Neg liver ROS,   Endo/Other  negative endocrine ROS  Renal/GU negative Renal ROS  negative genitourinary   Musculoskeletal negative musculoskeletal ROS (+)   Abdominal (+) + obese,   Peds negative pediatric ROS (+)  Hematology negative hematology ROS (+)   Anesthesia Other Findings   Reproductive/Obstetrics (+) Pregnancy                             Anesthesia Physical Anesthesia Plan  ASA: III  Anesthesia Plan: Epidural   Post-op Pain Management:    Induction:   PONV Risk Score and Plan:   Airway Management Planned:   Additional Equipment:   Intra-op Plan:   Post-operative Plan:   Informed Consent:   Plan Discussed with:   Anesthesia Plan Comments:         Anesthesia Quick Evaluation

## 2019-03-11 NOTE — Progress Notes (Signed)
Labor Progress Note Kristi Wu is a 38 y.o. female, G3P1011 at 39+4 weeks, presenting for induction of labor.   Subjective:  Four hours after first misoprostol placed, patient c/o of 6/10 pain from contractions and contracting 5-6 x in 10 minutes.  RN SVE was 1/50/-3 so I proposed to Kristi Wu to place a foley bulb and discussed the r/b/a. She consented and was given a dose of fentanyl for the placement. Then an attempt was made to place FB but failed.  Objective:  BP 130/74   Pulse 91   Temp 98.2 F (36.8 C) (Oral)   Resp 16   Ht 5\' 2"  (1.575 m)   Wt 103.4 kg   LMP 06/14/2018   BMI 41.68 kg/m     FHT: Baseline 140, NICHD category 1, moderate variability, accels present, decels absent. UC:   regular, every 2 minutes SVE:   Dilation: 1 Effacement (%): 50 Station: -3 Exam by:: s seagraves rn Pitocin at n/a MVUs n/a  Assessment/Plan:  Fetal Wellbeing:  cat 1 Labor: frequent, painful contractions. 1/50/-3 Preeclampsia: one mild range pressure, denies sx. Will order [facility] labs now. GBS:  neg  Pain Control:   Anticipated MOD:  SVD    Jonetta Speak, CNM 03/11/2019, 6:24 AM

## 2019-03-11 NOTE — Progress Notes (Signed)
Subjective: Pt feeling some rectal pressure.  Feeling nauseated and having some emesis.  Support person in room  Objective: BP 125/80   Pulse (!) 105   Temp 99.1 F (37.3 C) (Oral)   Resp 18   Ht 5\' 2"  (1.575 m)   Wt 103.4 kg   LMP 06/14/2018   BMI 41.68 kg/m  No intake/output data recorded. No intake/output data recorded.  FHT: Category 2 FHT 155 accels noted variable decels with contractions to 120 UC:   regular, every 2-3 minutes SVE:   Dilation: 8 Effacement (%): 100 Station: -2 Exam by:: N. Prothero CNM  Pitocin at 10 units MVUs 270  Assessment:  Pt is a 38 yo G3P1011 at 39.4 weeks in active labor Cat 2 strip  Plan: Monitor progress and EFM Anticipate SVD  Kenney Houseman CNM, MSN 03/11/2019, 8:32 PM

## 2019-03-12 LAB — CBC
HCT: 32 % — ABNORMAL LOW (ref 36.0–46.0)
Hemoglobin: 10.9 g/dL — ABNORMAL LOW (ref 12.0–15.0)
MCH: 29.7 pg (ref 26.0–34.0)
MCHC: 34.1 g/dL (ref 30.0–36.0)
MCV: 87.2 fL (ref 80.0–100.0)
Platelets: 221 10*3/uL (ref 150–400)
RBC: 3.67 MIL/uL — ABNORMAL LOW (ref 3.87–5.11)
RDW: 15 % (ref 11.5–15.5)
WBC: 16.3 10*3/uL — ABNORMAL HIGH (ref 4.0–10.5)
nRBC: 0 % (ref 0.0–0.2)

## 2019-03-12 MED ORDER — DIPHENHYDRAMINE HCL 25 MG PO CAPS
25.0000 mg | ORAL_CAPSULE | Freq: Four times a day (QID) | ORAL | Status: DC | PRN
  Administered 2019-03-12: 25 mg via ORAL
  Filled 2019-03-12: qty 1

## 2019-03-12 MED ORDER — ZOLPIDEM TARTRATE 5 MG PO TABS: 5.0000 mg | ORAL_TABLET | Freq: Every evening | ORAL | Status: DC | PRN

## 2019-03-12 MED ORDER — ACETAMINOPHEN 325 MG PO TABS
650.0000 mg | ORAL_TABLET | ORAL | Status: DC | PRN
  Administered 2019-03-12 (×3): 650 mg via ORAL
  Filled 2019-03-12 (×3): qty 2

## 2019-03-12 MED ORDER — DIBUCAINE 1 % RE OINT: 1.0000 "application " | TOPICAL_OINTMENT | RECTAL | Status: DC | PRN

## 2019-03-12 MED ORDER — COCONUT OIL OIL: 1.0000 "application " | TOPICAL_OIL | Status: DC | PRN

## 2019-03-12 MED ORDER — TETANUS-DIPHTH-ACELL PERTUSSIS 5-2.5-18.5 LF-MCG/0.5 IM SUSP: 0.5000 mL | Freq: Once | INTRAMUSCULAR | Status: DC

## 2019-03-12 MED ORDER — WITCH HAZEL-GLYCERIN EX PADS: 1.0000 "application " | MEDICATED_PAD | CUTANEOUS | Status: DC | PRN

## 2019-03-12 MED ORDER — IBUPROFEN 600 MG PO TABS
600.0000 mg | ORAL_TABLET | Freq: Four times a day (QID) | ORAL | Status: DC
  Administered 2019-03-12 – 2019-03-13 (×3): 600 mg via ORAL
  Filled 2019-03-12 (×5): qty 1

## 2019-03-12 MED ORDER — SIMETHICONE 80 MG PO CHEW: 80.0000 mg | CHEWABLE_TABLET | ORAL | Status: DC | PRN

## 2019-03-12 MED ORDER — BENZOCAINE-MENTHOL 20-0.5 % EX AERO
1.0000 "application " | INHALATION_SPRAY | CUTANEOUS | Status: DC | PRN
  Administered 2019-03-12 – 2019-03-13 (×2): 1 via TOPICAL
  Filled 2019-03-12 (×2): qty 56

## 2019-03-12 MED ORDER — PRENATAL MULTIVITAMIN CH: 1.0000 | ORAL_TABLET | Freq: Every day | ORAL | Status: DC

## 2019-03-12 MED ORDER — SENNOSIDES-DOCUSATE SODIUM 8.6-50 MG PO TABS
2.0000 | ORAL_TABLET | ORAL | Status: DC
  Administered 2019-03-12 – 2019-03-13 (×2): 2 via ORAL
  Filled 2019-03-12 (×2): qty 2

## 2019-03-12 MED ORDER — ONDANSETRON HCL 4 MG/2ML IJ SOLN: 4.0000 mg | INTRAMUSCULAR | Status: DC | PRN

## 2019-03-12 MED ORDER — ONDANSETRON HCL 4 MG PO TABS: 4.0000 mg | ORAL_TABLET | ORAL | Status: DC | PRN

## 2019-03-12 NOTE — Lactation Note (Addendum)
This note was copied from a baby's chart. Lactation Consultation Note  Patient Name: Kristi Wu XTKWI'O Date: 03/12/2019 Reason for consult: Follow-up assessment;Infant weight loss;Maternal endocrine disorder Type of Endocrine Disorder?: Diabetes(GDM)  68 hours old FT female who is being exclusively BF by his mother. LC returned to check on baby to see if he ate. Mom voiced baby woke up on his own at 1:10 pm and fed for 20 minutes; she heard swallows when baby was at the breast. Then he had his bath, baby is now asleep and doing STS with mom; mom said baby wasn't due for another feeding right now but that she'll call for assistance if needed. Feeding plan to continue. Mom reported all questions and concerns were answered, she's aware of LC services and will call PRN.  Maternal Data Formula Feeding for Exclusion: No Has patient been taught Hand Expression?: Yes Does the patient have breastfeeding experience prior to this delivery?: Yes  Feeding Feeding Type: Breast Fed  Interventions Interventions: Breast feeding basics reviewed  Lactation Tools Discussed/Used WIC Program: No   Consult Status Consult Status: Follow-up Date: 03/13/19 Follow-up type: In-patient    Kristi Wu 03/12/2019, 3:00 PM

## 2019-03-12 NOTE — Anesthesia Postprocedure Evaluation (Signed)
Anesthesia Post Note  Patient: Kristi Wu  Procedure(s) Performed: AN AD HOC LABOR EPIDURAL     Patient location during evaluation: Mother Baby Anesthesia Type: Epidural Level of consciousness: awake, awake and alert and oriented Pain management: pain level controlled Vital Signs Assessment: post-procedure vital signs reviewed and stable Respiratory status: spontaneous breathing and respiratory function stable Cardiovascular status: blood pressure returned to baseline Postop Assessment: no headache, no backache, epidural receding, patient able to bend at knees, no apparent nausea or vomiting, adequate PO intake and able to ambulate Anesthetic complications: no    Last Vitals:  Vitals:   03/12/19 0054 03/12/19 0452  BP: 110/70 122/78  Pulse: 88 92  Resp: 16 16  Temp: 36.8 C 36.9 C  SpO2: 97%     Last Pain:  Vitals:   03/12/19 0452  TempSrc: Oral  PainSc: Asleep   Pain Goal:                   Cleda Clarks

## 2019-03-12 NOTE — Lactation Note (Signed)
This note was copied from a baby's chart. Lactation Consultation Note  Patient Name: Kristi Wu SHFWY'O Date: 03/12/2019 Reason for consult: Initial assessment;Infant weight loss;Maternal endocrine disorder Type of Endocrine Disorder?: Diabetes(GDM)  14 hours old FT female who is being exclusively BF by his mother, she's a P2 but not very experienced BF. She experienced BF difficulties with her her child, baby was difficult to latch on and she only BF for 2-3 weeks; she also mentioned she suffered from post-partum depression after her first baby was born; she was on Zolof during this pregnancy, an L2. But mom voiced that this baby is "different" and BF like a champ since the moment he was born. Mom is also familiar with hand expression and has been able to get drops of colostrum and put them in baby's mouth. She has a [employer]ra DEBP at home.  Mom doing STS with baby when entering the room, she voiced her concern that baby has not had a good feeding since 4 am this morning, because he was taken away as soon as she started feeding him to get circumcised and then when he came back and she tried to feed him, he fell asleep at the breast after a couple of minutes. LC offered assistance with latch but mom politely declined stating she just tried to feed baby. LC recommended mom to try again around 2-3 pm once the Tylenol has worn off. Discussed normal newborn behavior, feeding cues and pumping, mom may consider pumping while at the hospital, [facility] also offered to set up a DEBP but mom not ready to start yet, she'll think about it.  Feeding plan:  1. Encouraged mom to feed baby STS 8-12 times/24 hours or sooner if feeding cues are present 2. Hand expression and spoon/finger feeding was also encouraged, especially prior latching baby to the breast  BF brochure and feeding diary were reviewed. Dad was asleep. Mom reported all questions and concerns were answered, she's aware of LC services and will call  PRN.   Maternal Data Formula Feeding for Exclusion: No Has patient been taught Hand Expression?: Yes Does the patient have breastfeeding experience prior to this delivery?: Yes  Feeding   Interventions Interventions: Breast feeding basics reviewed  Lactation Tools Discussed/Used WIC Program: No   Consult Status Consult Status: Follow-up Date: 03/13/19 Follow-up type: In-patient    Charita Lindenberger Venetia Constable 03/12/2019, 12:12 PM

## 2019-03-12 NOTE — Progress Notes (Signed)
Subjective: Postpartum Day # 1 : S/P NSVD due to Kristi Wu is a 38 y.o. female, G3P1011 at 39+4 weeks, presenting for induction of labor. Pregnancy was complicated by anxiety well controlled by sertraline, asthma with use of albuterol inhaler, anemia, mild polyhydramnios  with most recent AFI 25 on 03/09/19 , and impaired glucose tolerance (elevated 1h glucola, did not tolerate 3h test,  A1C of 5.5, recent BS log shows fastings <90, post prandials <120). Kristi Wu is also morbidly obese and of advanced maternal age. She had proteinuria, PCR 0.87 at 31 weeks, then PCR 0.22 at 36 weeks. Blood work normal. Blood pressures elevated in office at on occasions, was not diagnosed with GHTN. Currently BP 09/78, pt denies HA, RUQ pain, no vision changes. Patient up ad lib, denies syncope or dizziness. Reports consuming regular diet without issues and denies N/V. Patient reports 0 bowel movement + passing flatus.  Denies issues with urination and reports bleeding is "lighter."  Patient is breastfeeding and reports going well.  Desires pills for postpartum contraception.  Pain is being appropriately managed with use of po meds. HGB droped from  12.1-10.9, no s/sx of anemia.   periurthral laceration Feeding: Breast Contraceptive plan:  Pills BB: Circ , In pt desired and to be performed before discharge today, pt requested early discharge, Dr Mora Appl agreeable.   Objective: Vital signs in last 24 hours: Patient Vitals for the past 24 hrs:  BP Temp Temp src Pulse Resp SpO2  03/12/19 0953 109/78 97.8 F (36.6 C) Oral 79 16 97 %  03/12/19 0452 122/78 98.5 F (36.9 C) Oral 92 16 -  03/12/19 0054 110/70 98.2 F (36.8 C) Oral 88 16 97 %  03/11/19 2330 (!) 141/74 99.5 F (37.5 C) Oral 92 18 98 %  03/11/19 2301 111/69 - - 93 - -  03/11/19 2246 123/74 - - 91 - -  03/11/19 2231 120/75 - - (!) 113 18 -  03/11/19 2216 109/83 - - 97 - -  03/11/19 2207 124/69 - - 99 - -  03/11/19 2145 122/70 - - (!) 104 - -   03/11/19 2131 (!) 109/96 - - (!) 153 - -  03/11/19 2101 117/79 - - 95 18 -  03/11/19 2031 110/61 - - (!) 104 20 -  03/11/19 2001 125/80 - - (!) 105 18 -  03/11/19 1931 125/68 - - 88 18 -  03/11/19 1900 125/73 - - 79 14 -  03/11/19 1831 121/77 - - 82 16 -  03/11/19 1801 140/73 99.1 F (37.3 C) Oral 80 18 -  03/11/19 1733 132/83 99.5 F (37.5 C) Oral 87 18 -  03/11/19 1701 113/76 - - 94 - -  03/11/19 1631 120/77 - - 92 16 -  03/11/19 1601 117/75 - - 81 17 -  03/11/19 1531 119/81 - - 99 16 -  03/11/19 1500 123/75 - - 80 15 -  03/11/19 1431 130/78 - - 87 14 -  03/11/19 1416 126/79 - - 93 - -  03/11/19 1401 122/81 - - 92 16 -  03/11/19 1346 122/85 - - 85 - -  03/11/19 1331 116/78 - - 91 15 -  03/11/19 1322 129/71 - - 90 16 -  03/11/19 1319 132/84 - - 89 14 -  03/11/19 1317 121/88 - - 90 18 -  03/11/19 1314 (!) 83/30 - - (!) 133 16 -  03/11/19 1301 105/67 - - 90 17 -  03/11/19 1256 106/72 - - 87 18 -  03/11/19 1250 (!) 77/66 - - (!) 103 18 -  03/11/19 1246 (!) 90/54 - - 70 15 -  03/11/19 1243 (!) 92/57 - - 62 14 -  03/11/19 1235 99/64 - - 71 16 -  03/11/19 1232 (!) 92/55 - - 72 14 -  03/11/19 1226 108/61 - - 71 15 -  03/11/19 1221 93/65 - - 64 15 -  03/11/19 1219 (!) 85/50 - - 68 16 -  03/11/19 1216 (!) 99/50 - - 83 14 -     Physical Exam:  General: alert, cooperative, appears stated age and no distress Mood/Affect: Happy Lungs: clear to auscultation, no wheezes, rales or rhonchi, symmetric air entry.  Heart: normal rate, regular rhythm, normal S1, S2, no murmurs, rubs, clicks or gallops. Breast: breasts appear normal, no suspicious masses, no skin or nipple changes or axillary nodes. Abdomen:  + bowel sounds, soft, non-tender GU: perineum Intact, healing well. No signs of external hematomas.  Uterine Fundus: firm Lochia: appropriate Skin: Warm, Dry. DVT Evaluation: No evidence of DVT seen on physical exam. Negative Homan's sign. No cords or calf tenderness. No  significant calf/ankle edema.  CBC Latest Ref Rng & Units 03/12/2019 03/11/2019 03/08/2008  WBC 4.0 - 10.5 K/uL 16.3(H) 9.7 18.0(H)  Hemoglobin 12.0 - 15.0 g/dL 10.9(L) 12.1 11.7(L)  Hematocrit 36.0 - 46.0 % 32.0(L) 35.7(L) 33.3(L)  Platelets 150 - 400 K/uL 221 240 182 DELTA CHECK NOTED    Results for orders placed or performed during the hospital encounter of 03/11/19 (from the past 24 hour(s))  CBC     Status: Abnormal   Collection Time: 03/12/19  4:11 AM  Result Value Ref Range   WBC 16.3 (H) 4.0 - 10.5 K/uL   RBC 3.67 (L) 3.87 - 5.11 MIL/uL   Hemoglobin 10.9 (L) 12.0 - 15.0 g/dL   HCT 50.2 (L) 77.4 - 12.8 %   MCV 87.2 80.0 - 100.0 fL   MCH 29.7 26.0 - 34.0 pg   MCHC 34.1 30.0 - 36.0 g/dL   RDW 78.6 76.7 - 20.9 %   Platelets 221 150 - 400 K/uL   nRBC 0.0 0.0 - 0.2 %     CBG (last 3)  No results for input(s): GLUCAP in the last 72 hours.   I/O last 3 completed shifts: In: -  Out: 375 [Urine:275; Blood:100]   Assessment Postpartum Day # 1 : S/P NSVD due to Kristi Wu is a 38 y.o. female, G3P1011 at 39+4 weeks, presenting for induction of labor. Pregnancy was complicated by anxiety well controlled by sertraline, asthma with use of albuterol inhaler, anemia, mild polyhydramnios  with most recent AFI 25 on 03/09/19 , and impaired glucose tolerance (elevated 1h glucola, did not tolerate 3h test,  A1C of 5.5, recent BS log shows fastings <90, post prandials <120). Kristi Wu is also morbidly obese and of advanced maternal age. She had proteinuria, PCR 0.87 at 31 weeks, then PCR 0.22 at 36 weeks. Blood work normal. Blood pressures elevated in office at on occasions, was not diagnosed with GHTN. BP 109/78. Pt stable. -2 involution. breastfeeding. Hemodynamically stable and droped from  12.1-10.9, no s/sx of anemia. Early discharge today post circ being performed by Dr Mora Appl.   Plan: Continue other mgmt as ordered VTE prophylactics: Early ambulated as tolerates.  Pain control:  Motrin/Tylenol PRN Education given regarding options for contraception, including barrier methods, injectable contraception, IUD placement, oral contraceptives.  Discharge home, Breastfeeding, Lactation consult and Circumcision prior to discharge  Dr.  Pinn to be updated on patient status  [facility] NP-C, CNM 03/12/2019, 12:15 PM

## 2019-03-12 NOTE — Progress Notes (Signed)
Mother of baby was referred for history of anxiety. Referral screened out by CSW because per chart review, MOB is actively taking 100mg of Zoloft daily to address her symptoms.   Please contact CSW if mother of baby requests, if needs arise, or if mother of baby scores greater than a nine or answers yes to question ten on [place] Postpartum Depression Screen.   Kaige Whistler, MSW, LCSW-A Clinical Social Worker [facility] [phone]     

## 2019-03-13 MED ORDER — IBUPROFEN 600 MG PO TABS: 600.0000 mg | ORAL_TABLET | Freq: Four times a day (QID) | ORAL | 0 refills | Status: DC | PRN

## 2019-03-13 NOTE — Discharge Summary (Signed)
OB Discharge Summary     Patient Name: Kristi Wu DOB: 06/20/1981 MRN: [id number]  Date of admission: 03/11/2019 Delivering MD: Kenney Houseman   Date of discharge: 03/13/2019  Admitting diagnosis: pregnancy Intrauterine pregnancy: [redacted]w[redacted]d     Secondary diagnosis:  Active Problems:   Proteinuria affecting pregnancy in third trimester   Anxiety   Obesity, Class III, BMI 40-49.9 (morbid obesity) (HCC)   Advanced maternal age in multigravida   Polyhydramnios   SVD (spontaneous vaginal delivery)   Discharge diagnosis: Term Pregnancy Delivered and Gestational Hypertension                                                                                                Post partum procedures:n/a  Augmentation: AROM, Pitocin, Cytotec and Foley Balloon  Complications: None  Hospital course:  Induction of Labor With Vaginal Delivery   38 y.o. yo [id number] at [redacted]w[redacted]d was admitted to the hospital 03/11/2019 for induction of labor.  Indication for induction: Gestational hypertension and polyhydramnios, proteinuria.  Patient had an uncomplicated labor course as follows: Membrane Rupture Time/Date: 5:30 PM ,03/11/2019   Intrapartum Procedures: Episiotomy: None [1]                                         Lacerations:  Periurethral [8]  Patient had delivery of a Viable infant.  Information for the patient's newborn:  Pennie, Shaw [[id number]]  Delivery Method: Vaginal, Spontaneous(Filed from Delivery Summary)   03/11/2019  Details of delivery can be found in separate delivery note.  Patient had a routine postpartum course. Patient is discharged home 03/13/19.  Physical exam  Vitals:   03/12/19 0953 03/12/19 1225 03/12/19 1418 03/13/19 0517  BP: 109/78 104/69 131/81 102/66  Pulse: 79 83 83 68  Resp: 16 16 18 16   Temp: 97.8 F (36.6 C) 97.9 F (36.6 C) 98 F (36.7 C) 97.7 F (36.5 C)  TempSrc: Oral Oral Oral Oral  SpO2: 97% 96% 98%   Weight:      Height:        General: alert, cooperative and no distress, mood is stable without symptoms of depression or anxiety  Lochia: appropriate Uterine Fundus: firm Incision: N/A DVT Evaluation: No evidence of DVT seen on physical exam. Negative Homan's sign. No cords or calf tenderness. No significant calf/ankle edema. Gestational hypertension: Blood pressures are stable and patient denies symptoms of preeclampsia. Consulted Dr. Mora Appl, patient to discharge home in stable condition today. Baby Love RN visits next week for blood pressure checks and patient to take her blood pressure at home as well. Preeclampsia precautions discussed and handout given. Patient to call if she has elevated blood pressures greater than 150/100 or if she becomes symptomatic.   Labs: Lab Results  Component Value Date   WBC 16.3 (H) 03/12/2019   HGB 10.9 (L) 03/12/2019   HCT 32.0 (L) 03/12/2019   MCV 87.2 03/12/2019   PLT 221 03/12/2019   CMP Latest Ref Rng & Units 03/11/2019  Glucose  70 - 99 mg/dL 98  BUN 6 - 20 mg/dL 5(L)  Creatinine 9.98 - 1.00 mg/dL 3.38  Sodium 250 - 539 mmol/L 135  Potassium 3.5 - 5.1 mmol/L 3.9  Chloride 98 - 111 mmol/L 104  CO2 22 - 32 mmol/L 20(L)  Calcium 8.9 - 10.3 mg/dL 7.6(B)  Total Protein 6.5 - 8.1 g/dL 6.2(L)  Total Bilirubin 0.3 - 1.2 mg/dL 0.4  Alkaline Phos 38 - 126 U/L 112  AST 15 - 41 U/L 21  ALT 0 - 44 U/L 17    Discharge instruction: per After Visit Summary and "Baby and Me Booklet". Preeclampsia and postpartum anxiety/depression precautions discussed and handout given.   After visit meds:  Allergies as of 03/13/2019      Reactions   Doxycycline Hives   Amoxicillin Rash   Penicillins Rash   Did it involve swelling of the face/tongue/throat, SOB, or low BP? Unknown Did it involve sudden or severe rash/hives, skin peeling, or any reaction on the inside of your mouth or nose? Unknown Did you need to seek medical attention at a hospital or doctor's office? Unknown When did it  last happen?childhood reaction If all above answers are "NO", may proceed with cephalosporin use.   Sulfa Antibiotics Rash      Medication List    STOP taking these medications   aspirin EC 81 MG tablet     TAKE these medications   albuterol 108 (90 Base) MCG/ACT inhaler Commonly known as:  PROVENTIL HFA;VENTOLIN HFA Inhale 1-2 puffs into the lungs every 6 (six) hours as needed for wheezing or shortness of breath.   fexofenadine 60 MG tablet Commonly known as:  ALLEGRA Take 60 mg by mouth daily as needed for allergies.   ibuprofen 600 MG tablet Commonly known as:  ADVIL,MOTRIN Take 1 tablet (600 mg total) by mouth every 6 (six) hours as needed for moderate pain.   PRENATAL VITAMIN PO Take 1 tablet by mouth daily.   sertraline 100 MG tablet Commonly known as:  ZOLOFT       Diet: routine diet  Activity: Advance as tolerated. Pelvic rest for 6 weeks.   Outpatient follow up:6 weeks Follow up Appt:No future appointments. Follow up Visit:No follow-ups on file.  Postpartum contraception: Progesterone only pills  Newborn Data: Live born female  Birth Weight: 7 lb 7.2 oz (3379 g) APGAR: 7, 9  Newborn Delivery   Birth date/time:  03/11/2019 21:29:00 Delivery type:  Vaginal, Spontaneous     Baby Feeding: Breast Disposition:home with mother   03/13/2019 [name], CNM

## 2019-03-13 NOTE — Discharge Instructions (Signed)
Postpartum Care After Vaginal Delivery ° °The period of time right after you deliver your newborn is called the postpartum period. °What kind of medical care will I receive? °· You may continue to receive fluids and medicines through an IV tube inserted into one of your veins. °· If an incision was made near your vagina (episiotomy) or if you had some vaginal tearing during delivery, cold compresses may be placed on your episiotomy or your tear. This helps to reduce pain and swelling. °· You may be given a squirt bottle to use when you go to the bathroom. You may use this until you are comfortable wiping as usual. To use the squirt bottle, follow these steps: °? Before you urinate, fill the squirt bottle with warm water. Do not use hot water. °? After you urinate, while you are sitting on the toilet, use the squirt bottle to rinse the area around your urethra and vaginal opening. This rinses away any urine and blood. °? You may do this instead of wiping. As you start healing, you may use the squirt bottle before wiping yourself. Make sure to wipe gently. °? Fill the squirt bottle with clean water every time you use the bathroom. °· You will be given sanitary pads to wear. °How can I expect to feel? °· You may not feel the need to urinate for several hours after delivery. °· You will have some soreness and pain in your abdomen and vagina. °· If you are breastfeeding, you may have uterine contractions every time you breastfeed for up to several weeks postpartum. Uterine contractions help your uterus return to its normal size. °· It is normal to have vaginal bleeding (lochia) after delivery. The amount and appearance of lochia is often similar to a menstrual period in the first week after delivery. It will gradually decrease over the next few weeks to a dry, yellow-brown discharge. For most women, lochia stops completely by 6-8 weeks after delivery. Vaginal bleeding can vary from woman to woman. °· Within the first few  days after delivery, you may have breast engorgement. This is when your breasts feel heavy, full, and uncomfortable. Your breasts may also throb and feel hard, tightly stretched, warm, and tender. After this occurs, you may have milk leaking from your breasts. Your health care provider can help you relieve discomfort due to breast engorgement. Breast engorgement should go away within a few days. °· You may feel more sad or worried than normal due to hormonal changes after delivery. These feelings should not last more than a few days. If these feelings do not go away after several days, speak with your health care provider. °How should I care for myself? °· Tell your health care provider if you have pain or discomfort. °· Drink enough water to keep your urine clear or pale yellow. °· Wash your hands thoroughly with soap and water for at least 20 seconds after changing your sanitary pads, after using the toilet, and before holding or feeding your baby. °· If you are not breastfeeding, avoid touching your breasts a lot. Doing this can make your breasts produce more milk. °· If you become weak or lightheaded, or you feel like you might faint, ask for help before: °? Getting out of bed. °? Showering. °· Change your sanitary pads frequently. Watch for any changes in your flow, such as a sudden increase in volume, a change in color, the passing of large blood clots. If you pass a blood clot from your vagina,   save it to show to your health care provider. Do not flush blood clots down the toilet without having your health care provider look at them. °· Make sure that all your vaccinations are up to date. This can help protect you and your baby from getting certain diseases. You may need to have immunizations done before you leave the hospital. °· If desired, talk with your health care provider about methods of family planning or birth control (contraception). °How can I start bonding with my baby? °Spending as much time as  possible with your baby is very important. During this time, you and your baby can get to know each other and develop a bond. Having your baby stay with you in your room (rooming in) can give you time to get to know your baby. Rooming in can also help you become comfortable caring for your baby. Breastfeeding can also help you bond with your baby. °How can I plan for returning home with my baby? °· Make sure that you have a car seat installed in your vehicle. °? Your car seat should be checked by a certified car seat installer to make sure that it is installed safely. °? Make sure that your baby fits into the car seat safely. °· Ask your health care provider any questions you have about caring for yourself or your baby. Make sure that you are able to contact your health care provider with any questions after leaving the hospital. °This information is not intended to replace advice given to you by your health care provider. Make sure you discuss any questions you have with your health care provider. °Document Released: 09/28/2007 Document Revised: 05/05/2016 Document Reviewed: 11/05/2015 °[employer] Interactive Patient Education © 2018 [employer] ° ° °Postpartum Depression and Baby Blues °The postpartum period begins right after the birth of a baby. During this time, there is often a great amount of joy and excitement. It is also a time of many changes in the life of the parents. Regardless of how many times a mother gives birth, each child brings new challenges and dynamics to the family. It is not unusual to have feelings of excitement along with confusing shifts in moods, emotions, and thoughts. All mothers are at risk of developing postpartum depression or the "baby blues." These mood changes can occur right after giving birth, or they may occur many months after giving birth. The baby blues or postpartum depression can be mild or severe. Additionally, postpartum depression can go away rather quickly, or it can  be a long-term condition. °What are the causes? °Raised hormone levels and the rapid drop in those levels are thought to be a main cause of postpartum depression and the baby blues. A number of hormones change during and after pregnancy. Estrogen and progesterone usually decrease right after the delivery of your baby. The levels of thyroid hormone and various cortisol steroids also rapidly drop. Other factors that play a role in these mood changes include major life events and genetics. °What increases the risk? °If you have any of the following risks for the baby blues or postpartum depression, know what symptoms to watch out for during the postpartum period. Risk factors that may increase the likelihood of getting the baby blues or postpartum depression include: °· Having a personal or family history of depression. °· Having depression while being pregnant. °· Having premenstrual mood issues or mood issues related to oral contraceptives. °· Having a lot of life stress. °· Having marital conflict. °· Lacking   a social support network. °· Having a baby with special needs. °· Having health problems, such as diabetes. ° °What are the signs or symptoms? °Symptoms of baby blues include: °· Brief changes in mood, such as going from extreme happiness to sadness. °· Decreased concentration. °· Difficulty sleeping. °· Crying spells, tearfulness. °· Irritability. °· Anxiety. ° °Symptoms of postpartum depression typically begin within the first month after giving birth. These symptoms include: °· Difficulty sleeping or excessive sleepiness. °· Marked weight loss. °· Agitation. °· Feelings of worthlessness. °· Lack of interest in activity or food. ° °Postpartum psychosis is a very serious condition and can be dangerous. Fortunately, it is rare. Displaying any of the following symptoms is cause for immediate medical attention. Symptoms of postpartum psychosis include: °· Hallucinations and delusions. °· Bizarre or disorganized  behavior. °· Confusion or disorientation. ° °How is this diagnosed? °A diagnosis is made by an evaluation of your symptoms. There are no medical or lab tests that lead to a diagnosis, but there are various questionnaires that a health care provider may use to identify those with the baby blues, postpartum depression, or psychosis. Often, a screening tool called the Edinburgh Postnatal Depression Scale is used to diagnose depression in the postpartum period. °How is this treated? °The baby blues usually goes away on its own in 1-2 weeks. Social support is often all that is needed. You will be encouraged to get adequate sleep and rest. Occasionally, you may be given medicines to help you sleep. °Postpartum depression requires treatment because it can last several months or longer if it is not treated. Treatment may include individual or group therapy, medicine, or both to address any social, physiological, and psychological factors that may play a role in the depression. Regular exercise, a healthy diet, rest, and social support may also be strongly recommended. °Postpartum psychosis is more serious and needs treatment right away. Hospitalization is often needed. °Follow these instructions at home: °· Get as much rest as you can. Nap when the baby sleeps. °· Exercise regularly. Some women find yoga and walking to be beneficial. °· Eat a balanced and nourishing diet. °· Do little things that you enjoy. Have a cup of tea, take a bubble bath, read your favorite magazine, or listen to your favorite music. °· Avoid alcohol. °· Ask for help with household chores, cooking, grocery shopping, or running errands as needed. Do not try to do everything. °· Talk to people close to you about how you are feeling. Get support from your partner, family members, friends, or other new moms. °· Try to stay positive in how you think. Think about the things you are grateful for. °· Do not spend a lot of time alone. °· Only take  over-the-counter or prescription medicine as directed by your health care provider. °· Keep all your postpartum appointments. °· Let your health care provider know if you have any concerns. °Contact a health care provider if: °You are having a reaction to or problems with your medicine. °Get help right away if: °· You have suicidal feelings. °· You think you may harm the baby or someone else. °This information is not intended to replace advice given to you by your health care provider. Make sure you discuss any questions you have with your health care provider. °Document Released: 09/04/2004 Document Revised: 05/08/2016 Document Reviewed: 09/12/2013 °[employer] Interactive Patient Education © 2017 [employer] ° ° °Preeclampsia and Eclampsia ° °Preeclampsia is a serious condition that may develop during pregnancy. It   is also called toxemia of pregnancy. This condition causes high blood pressure along with other symptoms, such as swelling and headaches. These symptoms may develop as the condition gets worse. Preeclampsia may occur at 20 weeks of pregnancy or later. °Diagnosing and treating preeclampsia early is very important. If not treated early, it can cause serious problems for you and your baby. One problem it can lead to is eclampsia. Eclampsia is a condition that causes muscle jerking or shaking (convulsions or seizures) and other serious problems for the mother. During pregnancy, delivering your baby may be the best treatment for preeclampsia or eclampsia. For most women, preeclampsia and eclampsia symptoms go away after giving birth. °In rare cases, a woman may develop preeclampsia after giving birth (postpartum preeclampsia). This usually occurs within 48 hours after childbirth but may occur up to 6 weeks after giving birth. °What are the causes? °The cause of preeclampsia is not known. °What increases the risk? °The following risk factors make you more likely to develop preeclampsia: °· Being pregnant for  the first time. °· Having had preeclampsia during a past pregnancy. °· Having a family history of preeclampsia. °· Having high blood pressure. °· Being pregnant with more than one baby. °· Being 35 or older. °· Being African-American. °· Having kidney disease or diabetes. °· Having medical conditions such as lupus or blood diseases. °· Being very overweight (obese). °What are the signs or symptoms? °The earliest signs of preeclampsia are: °· High blood pressure. °· Increased protein in your urine. Your health care provider will check for this at every visit before you give birth (prenatal visit). °Other symptoms that may develop as the condition gets worse include: °· Severe headaches. °· Sudden weight gain. °· Swelling of the hands, face, legs, and feet. °· Nausea and vomiting. °· Vision problems, such as blurred or double vision. °· Numbness in the face, arms, legs, and feet. °· Urinating less than usual. °· Dizziness. °· Slurred speech. °· Abdominal pain, especially upper abdominal pain. °· Convulsions or seizures. °How is this diagnosed? °There are no screening tests for preeclampsia. Your health care provider will ask you about symptoms and check for signs of preeclampsia during your prenatal visits. You may also have tests that include: °· Urine tests. °· Blood tests. °· Checking your blood pressure. °· Monitoring your baby’s heart rate. °· Ultrasound. °How is this treated? °You and your health care provider will determine the treatment approach that is best for you. Treatment may include: °· Having more frequent prenatal exams to check for signs of preeclampsia, if you have an increased risk for preeclampsia. °· Medicine to lower your blood pressure. °· Staying in the hospital, if your condition is severe. There, treatment will focus on controlling your blood pressure and the amount of fluids in your body (fluid retention). °· Taking medicine (magnesium sulfate) to prevent seizures. This may be given as an  injection or through an IV. °· Taking a low-dose aspirin during your pregnancy. °· Delivering your baby early, if your condition gets worse. You may have your labor started with medicine (induced), or you may have a cesarean delivery. °Follow these instructions at home: °Eating and drinking ° °· Drink enough fluid to keep your urine pale yellow. °· Avoid caffeine. °Lifestyle °· Do not use any products that contain nicotine or tobacco, such as cigarettes and e-cigarettes. If you need help quitting, ask your health care provider. °· Do not use alcohol or drugs. °· Avoid stress as much as possible. Rest   and get plenty of sleep. °General instructions °· Take over-the-counter and prescription medicines only as told by your health care provider. °· When lying down, lie on your left side. This keeps pressure off your major blood vessels. °· When sitting or lying down, raise (elevate) your feet. Try putting some pillows underneath your lower legs. °· Exercise regularly. Ask your health care provider what kinds of exercise are best for you. °· Keep all follow-up and prenatal visits as told by your health care provider. This is important. °How is this prevented? °There is no known way of preventing preeclampsia or eclampsia from developing. However, to lower your risk of complications and detect problems early: °· Get regular prenatal care. Your health care provider may be able to diagnose and treat the condition early. °· Maintain a healthy weight. Ask your health care provider for help managing weight gain during pregnancy. °· Work with your health care provider to manage any long-term (chronic) health conditions you have, such as diabetes or kidney problems. °· You may have tests of your blood pressure and kidney function after giving birth. °· Your health care provider may have you take low-dose aspirin during your next pregnancy. °Contact a health care provider if: °· You have symptoms that your health care provider told  you may require more treatment or monitoring, such as: °? Headaches. °? Nausea or vomiting. °? Abdominal pain. °? Dizziness. °? Light-headedness. °Get help right away if: °· You have severe: °? Abdominal pain. °? Headaches that do not get better. °? Dizziness. °? Vision problems. °? Confusion. °? Nausea or vomiting. °· You have any of the following: °? A seizure. °? Sudden, rapid weight gain. °? Sudden swelling in your hands, ankles, or face. °? Trouble moving any part of your body. °? Numbness in any part of your body. °? Trouble speaking. °? Abnormal bleeding. °· You faint. °Summary °· Preeclampsia is a serious condition that may develop during pregnancy. It is also called toxemia of pregnancy. °· This condition causes high blood pressure along with other symptoms, such as swelling and headaches. °· Diagnosing and treating preeclampsia early is very important. If not treated early, it can cause serious problems for you and your baby. °· Get help right away if you have symptoms that your health care provider told you to watch for. °This information is not intended to replace advice given to you by your health care provider. Make sure you discuss any questions you have with your health care provider. °Document Released: 11/28/2000 Document Revised: 11/17/2017 Document Reviewed: 07/07/2016 °[employer]vier Interactive Patient Education © 2019 [employer] ° °

## 2019-04-21 DIAGNOSIS — Z01419 Encounter for gynecological examination (general) (routine) without abnormal findings: Secondary | ICD-10-CM | POA: Diagnosis not present

## 2019-12-07 DIAGNOSIS — Z6841 Body Mass Index (BMI) 40.0 and over, adult: Secondary | ICD-10-CM | POA: Diagnosis not present

## 2019-12-07 DIAGNOSIS — Z20828 Contact with and (suspected) exposure to other viral communicable diseases: Secondary | ICD-10-CM | POA: Diagnosis not present

## 2020-05-28 DIAGNOSIS — N6459 Other signs and symptoms in breast: Secondary | ICD-10-CM | POA: Diagnosis not present

## 2020-05-28 DIAGNOSIS — R922 Inconclusive mammogram: Secondary | ICD-10-CM | POA: Diagnosis not present

## 2020-05-28 DIAGNOSIS — R5383 Other fatigue: Secondary | ICD-10-CM | POA: Diagnosis not present

## 2020-05-28 DIAGNOSIS — R21 Rash and other nonspecific skin eruption: Secondary | ICD-10-CM | POA: Diagnosis not present

## 2020-05-28 DIAGNOSIS — E559 Vitamin D deficiency, unspecified: Secondary | ICD-10-CM | POA: Diagnosis not present

## 2020-05-30 ENCOUNTER — Other Ambulatory Visit: Payer: Self-pay | Admitting: Family Medicine

## 2020-05-30 DIAGNOSIS — R922 Inconclusive mammogram: Secondary | ICD-10-CM

## 2020-05-30 DIAGNOSIS — N6459 Other signs and symptoms in breast: Secondary | ICD-10-CM

## 2020-05-31 ENCOUNTER — Other Ambulatory Visit: Payer: Self-pay | Admitting: Family Medicine

## 2020-05-31 DIAGNOSIS — N6459 Other signs and symptoms in breast: Secondary | ICD-10-CM

## 2020-05-31 DIAGNOSIS — R922 Inconclusive mammogram: Secondary | ICD-10-CM

## 2020-05-31 DIAGNOSIS — R9230 Dense breasts, unspecified: Secondary | ICD-10-CM

## 2020-06-13 ENCOUNTER — Other Ambulatory Visit: Payer: Self-pay

## 2020-06-13 ENCOUNTER — Ambulatory Visit
Admission: RE | Admit: 2020-06-13 | Discharge: 2020-06-13 | Disposition: A | Discharge status: Home / Self Care | Payer: BC Managed Care – PPO | Source: Ambulatory Visit | Attending: Family Medicine | Admitting: Family Medicine

## 2020-06-13 ENCOUNTER — Ambulatory Visit
Admission: RE | Admit: 2020-06-13 | Discharge: 2020-06-13 | Disposition: A | Discharge status: Home / Self Care | Payer: BLUE CROSS/BLUE SHIELD | Source: Ambulatory Visit | Attending: Family Medicine | Admitting: Family Medicine

## 2020-06-13 DIAGNOSIS — R9230 Dense breasts, unspecified: Secondary | ICD-10-CM

## 2020-06-13 DIAGNOSIS — N6459 Other signs and symptoms in breast: Secondary | ICD-10-CM

## 2020-06-13 DIAGNOSIS — R922 Inconclusive mammogram: Secondary | ICD-10-CM

## 2020-06-13 DIAGNOSIS — N6489 Other specified disorders of breast: Secondary | ICD-10-CM | POA: Diagnosis not present

## 2020-06-13 DIAGNOSIS — R928 Other abnormal and inconclusive findings on diagnostic imaging of breast: Secondary | ICD-10-CM | POA: Diagnosis not present

## 2020-09-06 DIAGNOSIS — Z01419 Encounter for gynecological examination (general) (routine) without abnormal findings: Secondary | ICD-10-CM | POA: Diagnosis not present

## 2021-03-05 DIAGNOSIS — G43009 Migraine without aura, not intractable, without status migrainosus: Secondary | ICD-10-CM | POA: Diagnosis not present

## 2021-03-05 DIAGNOSIS — J4520 Mild intermittent asthma, uncomplicated: Secondary | ICD-10-CM | POA: Diagnosis not present

## 2021-03-05 DIAGNOSIS — K2100 Gastro-esophageal reflux disease with esophagitis, without bleeding: Secondary | ICD-10-CM | POA: Diagnosis not present

## 2021-03-05 DIAGNOSIS — E559 Vitamin D deficiency, unspecified: Secondary | ICD-10-CM | POA: Diagnosis not present

## 2021-06-18 ENCOUNTER — Ambulatory Visit
Admission: RE | Admit: 2021-06-18 | Discharge: 2021-06-18 | Disposition: A | Discharge status: Home / Self Care | Payer: BC Managed Care – PPO | Source: Ambulatory Visit | Attending: Family Medicine | Admitting: Family Medicine

## 2021-06-18 ENCOUNTER — Other Ambulatory Visit: Payer: Self-pay | Admitting: Family Medicine

## 2021-06-18 ENCOUNTER — Other Ambulatory Visit: Payer: Self-pay

## 2021-06-18 DIAGNOSIS — R1032 Left lower quadrant pain: Secondary | ICD-10-CM

## 2021-06-18 DIAGNOSIS — K8020 Calculus of gallbladder without cholecystitis without obstruction: Secondary | ICD-10-CM | POA: Diagnosis not present

## 2021-06-18 DIAGNOSIS — N200 Calculus of kidney: Secondary | ICD-10-CM | POA: Diagnosis not present

## 2021-06-18 DIAGNOSIS — R109 Unspecified abdominal pain: Secondary | ICD-10-CM | POA: Diagnosis not present

## 2021-06-24 DIAGNOSIS — R933 Abnormal findings on diagnostic imaging of other parts of digestive tract: Secondary | ICD-10-CM | POA: Diagnosis not present

## 2021-06-24 DIAGNOSIS — K5732 Diverticulitis of large intestine without perforation or abscess without bleeding: Secondary | ICD-10-CM | POA: Diagnosis not present

## 2021-11-05 DIAGNOSIS — D125 Benign neoplasm of sigmoid colon: Secondary | ICD-10-CM | POA: Diagnosis not present

## 2021-11-05 DIAGNOSIS — R1084 Generalized abdominal pain: Secondary | ICD-10-CM | POA: Diagnosis not present

## 2021-11-05 DIAGNOSIS — K648 Other hemorrhoids: Secondary | ICD-10-CM | POA: Diagnosis not present

## 2021-11-05 DIAGNOSIS — K5732 Diverticulitis of large intestine without perforation or abscess without bleeding: Secondary | ICD-10-CM | POA: Diagnosis not present

## 2022-03-08 IMAGING — MG DIGITAL DIAGNOSTIC BILAT W/ TOMO W/ CAD
8 series · 8 of 24 positions shown · non-contrast
Comparison: None.

CLINICAL DATA: 39-year-old female with a rash on the inner right
breast.

EXAM:
DIGITAL DIAGNOSTIC BILATERAL MAMMOGRAM WITH CAD AND TOMO
ULTRASOUND RIGHT BREAST

[R CC synth-2D]
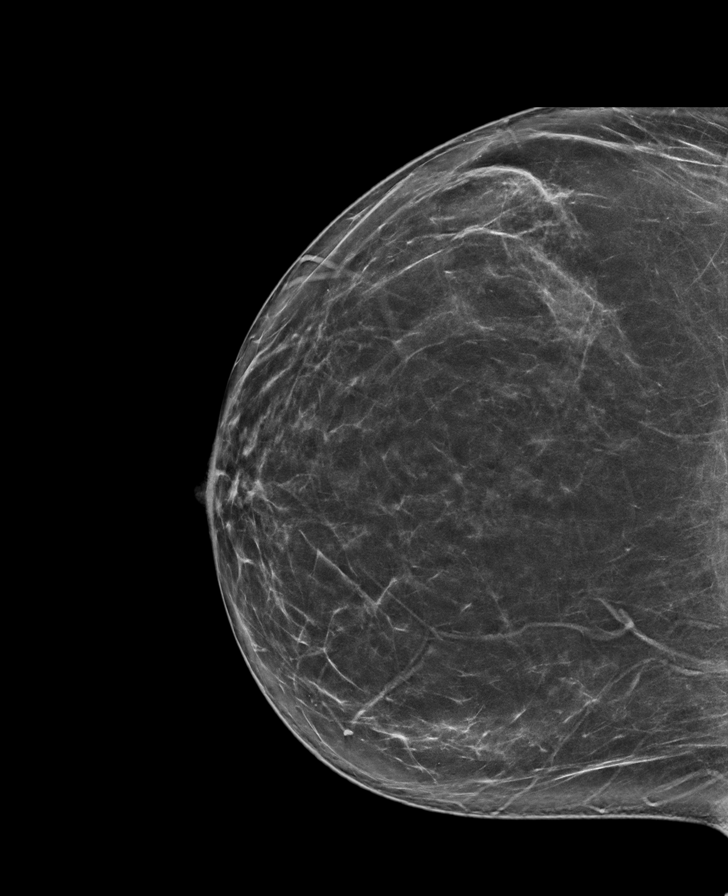

[L CC synth-2D]
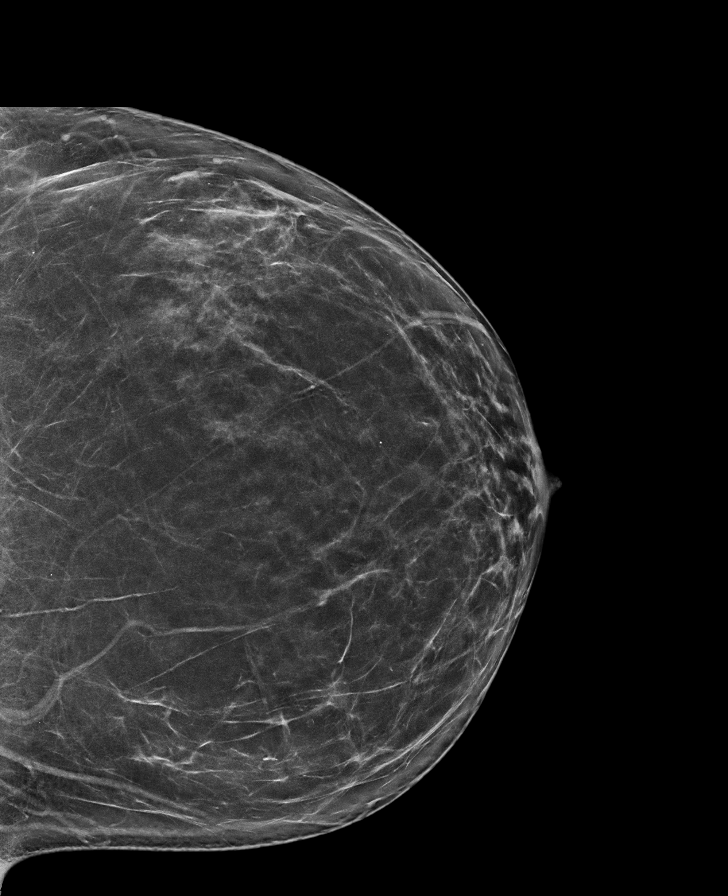

[L MLO synth-2D]
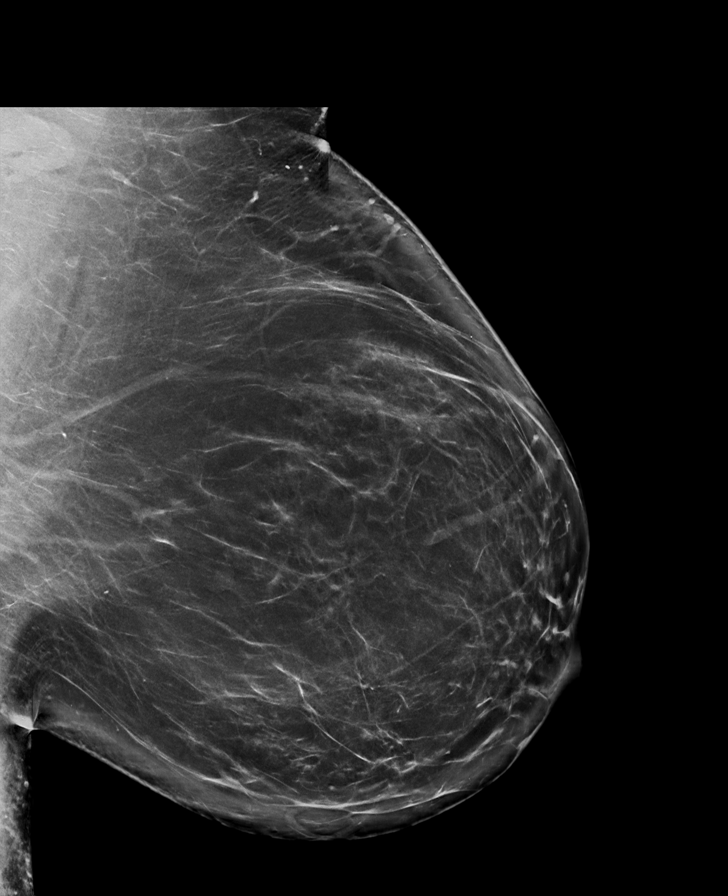

[R MLO synth-2D]
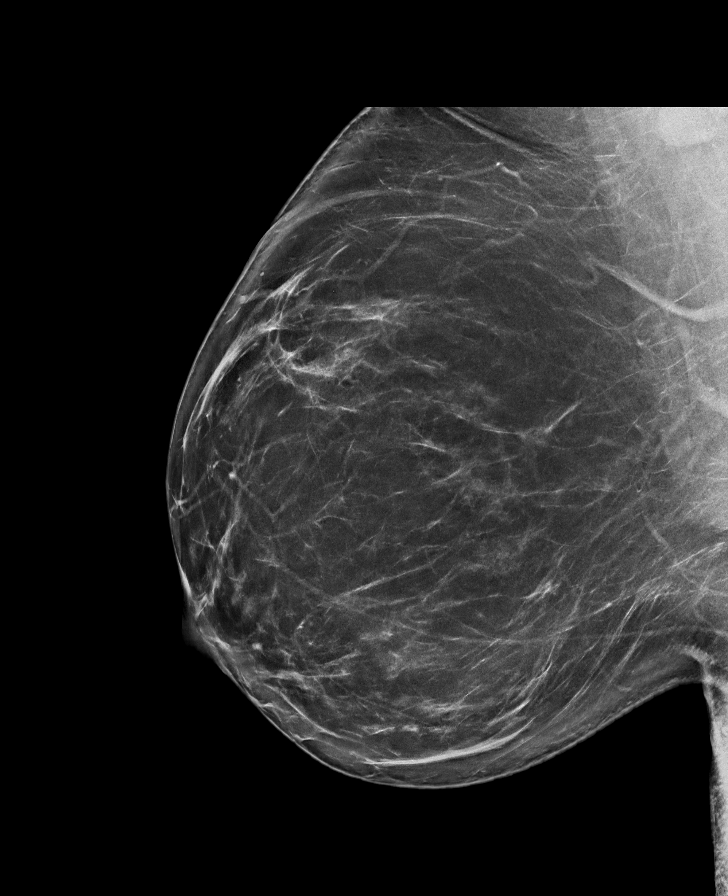

[L MLO tomo · tomo slice 52/103.0]
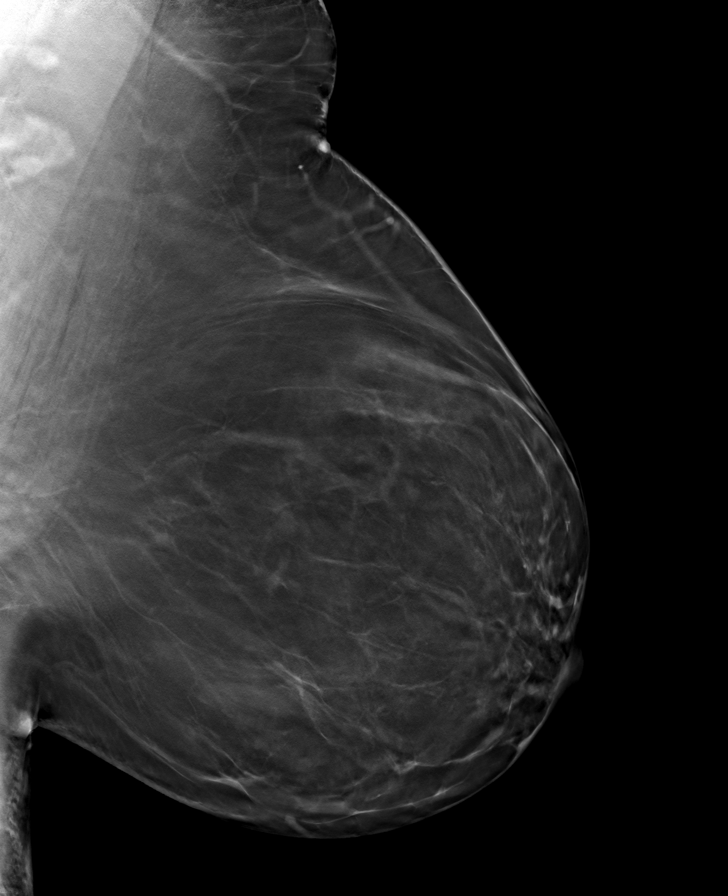

[R CC tomo · tomo slice 43/86.0]
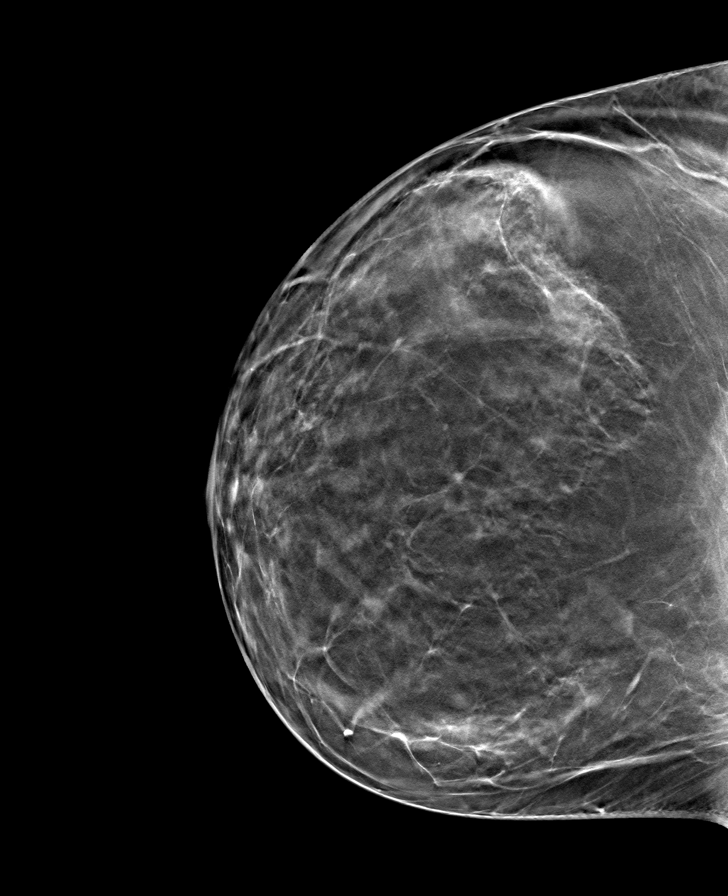

[R MLO tomo · tomo slice 53/105.0]
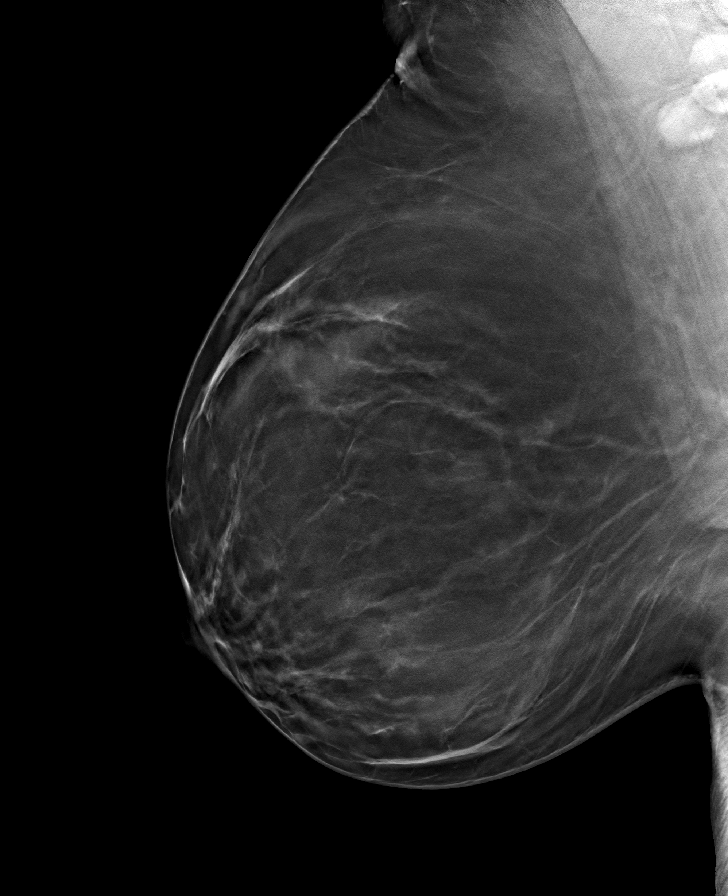

[L CC tomo · tomo slice 41/81.0]
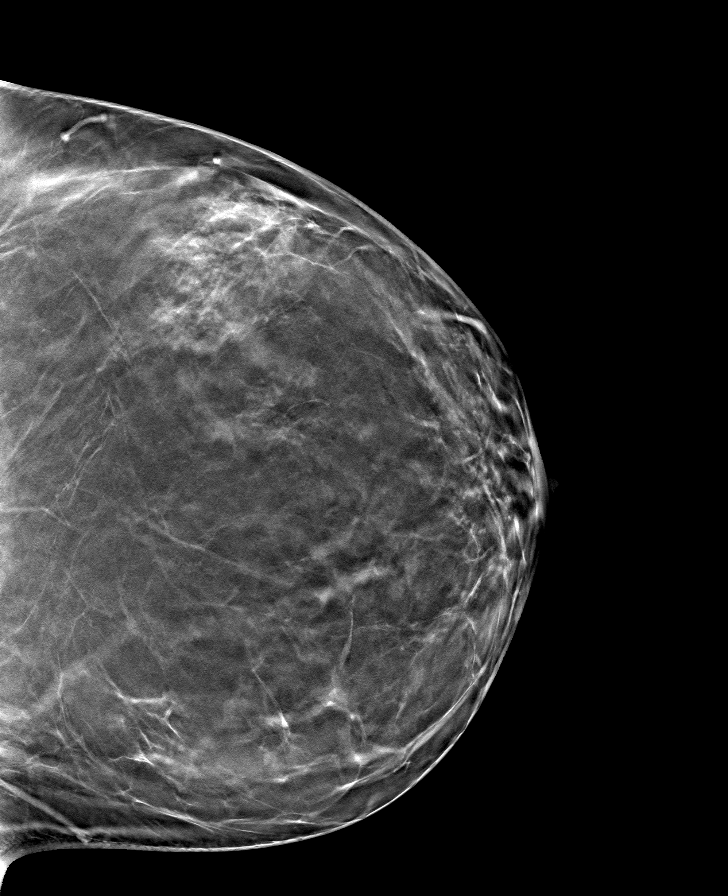

[8 of 24 positions shown; findings below may reference images not displayed]

ACR Breast Density Category b: There are scattered areas of
fibroglandular density.
FINDINGS: No suspicious mammographic findings are identified in either breast.

Mammographic images were processed with CAD.

On physical exam, there is a very subtle pink discoloration to a 3-4
cm patch of tissue in the medial right breast. No suspicious lumps
are palpated.

Targeted ultrasound is performed, showing normal fibroglandular
tissue without focal or suspicious sonographic abnormality. No skin
thickening or skin based lesions are identified.
IMPRESSION: No mammographic or sonographic evidence of malignancy in either
breast.

RECOMMENDATION:
1. Clinical follow-up recommended for the symptomatic area of
concern in the right breast. Any further workup should be based on
clinical grounds.
2. Screening mammogram at age 40 unless there are persistent or
intervening clinical concerns. (Code:[id number])

I have discussed the findings and recommendations with the patient.
If applicable, a reminder letter will be sent to the patient
regarding the next appointment.

BI-RADS CATEGORY  1: Negative.

## 2022-03-20 DIAGNOSIS — Z01419 Encounter for gynecological examination (general) (routine) without abnormal findings: Secondary | ICD-10-CM | POA: Diagnosis not present

## 2022-03-20 DIAGNOSIS — Z6841 Body Mass Index (BMI) 40.0 and over, adult: Secondary | ICD-10-CM | POA: Diagnosis not present

## 2022-03-20 DIAGNOSIS — Z124 Encounter for screening for malignant neoplasm of cervix: Secondary | ICD-10-CM | POA: Diagnosis not present

## 2022-07-30 DIAGNOSIS — U071 COVID-19: Secondary | ICD-10-CM | POA: Diagnosis not present

## 2022-07-30 DIAGNOSIS — J45909 Unspecified asthma, uncomplicated: Secondary | ICD-10-CM | POA: Diagnosis not present

## 2022-07-30 DIAGNOSIS — Z6841 Body Mass Index (BMI) 40.0 and over, adult: Secondary | ICD-10-CM | POA: Diagnosis not present

## 2022-09-04 DIAGNOSIS — R945 Abnormal results of liver function studies: Secondary | ICD-10-CM | POA: Diagnosis not present

## 2022-09-04 DIAGNOSIS — R1011 Right upper quadrant pain: Secondary | ICD-10-CM | POA: Diagnosis not present

## 2022-09-04 DIAGNOSIS — K8020 Calculus of gallbladder without cholecystitis without obstruction: Secondary | ICD-10-CM | POA: Diagnosis not present

## 2022-09-16 DIAGNOSIS — K8010 Calculus of gallbladder with chronic cholecystitis without obstruction: Secondary | ICD-10-CM | POA: Diagnosis not present

## 2022-09-30 ENCOUNTER — Ambulatory Visit: Payer: Self-pay | Admitting: Surgery

## 2022-09-30 NOTE — Progress Notes (Signed)
Sent message, via epic in basket, requesting orders in epic from surgeon.  

## 2022-10-03 NOTE — Patient Instructions (Signed)
SURGICAL WAITING ROOM VISITATION Patients having surgery or a procedure may have no more than 2 support people in the waiting area - these visitors may rotate.   Children under the age of 58 must have an adult with them who is not the patient. If the patient needs to stay at the hospital during part of their recovery, the visitor guidelines for inpatient rooms apply. Pre-op nurse will coordinate an appropriate time for 1 support person to accompany patient in pre-op.  This support person may not rotate.    Please refer to the [facility] website for the visitor guidelines for Inpatients (after your surgery is over and you are in a regular room).       Your procedure is scheduled on:  10/27/2022    Report to [facility] Main Entrance    Report to admitting at   130 pm    Call this number if you have problems the morning of surgery [phone]   Do not eat food :After Midnight.   After Midnight you may have the following liquids until __ 1230pm  DAY OF SURGERY  Water Non-Citrus Juices (without pulp, NO RED) Carbonated Beverages Black Coffee (NO MILK/CREAM OR CREAMERS, sugar ok)  Clear Tea (NO MILK/CREAM OR CREAMERS, sugar ok) regular and decaf                             Plain Jell-O (NO RED)                                           Fruit ices (not with fruit pulp, NO RED)                                     Popsicles (NO RED)                                                               Sports drinks like Gatorade (NO RED)              .                If you have questions, please contact your surgeon's office.        Oral Hygiene is also important to reduce your risk of infection.                                    Remember - BRUSH YOUR TEETH THE MORNING OF SURGERY WITH YOUR REGULAR TOOTHPASTE   Do NOT smoke after Midnight   Take these medicines the morning of surgery with A SIP OF WATER:  inhalers as usual and bring   DO NOT TAKE ANY ORAL DIABETIC  MEDICATIONS DAY OF YOUR SURGERY  Bring CPAP mask and tubing day of surgery.                              You may not have any metal on  your body including hair pins, jewelry, and body piercing             Do not wear make-up, lotions, powders, perfumes/cologne, or deodorant  Do not wear nail polish including gel and S&S, artificial/acrylic nails, or any other type of covering on natural nails including finger and toenails. If you have artificial nails, gel coating, etc. that needs to be removed by a nail salon please have this removed prior to surgery or surgery may need to be canceled/ delayed if the surgeon/ anesthesia feels like they are unable to be safely monitored.   Do not shave  48 hours prior to surgery.               Men may shave face and neck.   Do not bring valuables to the hospital. [place].   Contacts, dentures or bridgework may not be worn into surgery.   Bring small overnight bag day of surgery.   DO NOT [place]. PHARMACY WILL DISPENSE MEDICATIONS LISTED ON YOUR MEDICATION LIST TO YOU DURING YOUR ADMISSION [place]!    Patients discharged on the day of surgery will not be allowed to drive home.  Someone NEEDS to stay with you for the first 24 hours after anesthesia.   Special Instructions: Bring a copy of your healthcare power of attorney and living will documents the day of surgery if you haven't scanned them before.              Please read over the following fact sheets you were given: IF [place] [phone]   If you received a COVID test during your pre-op visit  it is requested that you wear a mask when out in public, stay away from anyone that may not be feeling well and notify your surgeon if you develop symptoms. If you test positive for Covid or have been in contact with anyone that has tested positive in the last  10 days please notify you surgeon.     [facility] - Preparing for Surgery Before surgery, you can play an important role.  Because skin is not sterile, your skin needs to be as free of germs as possible.  You can reduce the number of germs on your skin by washing with CHG (chlorahexidine gluconate) soap before surgery.  CHG is an antiseptic cleaner which kills germs and bonds with the skin to continue killing germs even after washing. Please DO NOT use if you have an allergy to CHG or antibacterial soaps.  If your skin becomes reddened/irritated stop using the CHG and inform your nurse when you arrive at Short Stay. Do not shave (including legs and underarms) for at least 48 hours prior to the first CHG shower.  You may shave your face/neck. Please follow these instructions carefully:  1.  Shower with CHG Soap the night before surgery and the  morning of Surgery.  2.  If you choose to wash your hair, wash your hair first as usual with your  normal  shampoo.  3.  After you shampoo, rinse your hair and body thoroughly to remove the  shampoo.                           4.  Use CHG as you would  any other liquid soap.  You can apply chg directly  to the skin and wash                       Gently with a scrungie or clean washcloth.  5.  Apply the CHG Soap to your body ONLY FROM THE NECK DOWN.   Do not use on face/ open                           Wound or open sores. Avoid contact with eyes, ears mouth and genitals (private parts).                       Wash face,  Genitals (private parts) with your normal soap.             6.  Wash thoroughly, paying special attention to the area where your surgery  will be performed.  7.  Thoroughly rinse your body with warm water from the neck down.  8.  DO NOT shower/wash with your normal soap after using and rinsing off  the CHG Soap.                9.  Pat yourself dry with a clean towel.            10.  Wear clean pajamas.            11.  Place clean sheets on your  bed the night of your first shower and do not  sleep with pets. Day of Surgery : Do not apply any lotions/deodorants the morning of surgery.  Please wear clean clothes to the hospital/surgery center.  FAILURE TO FOLLOW THESE INSTRUCTIONS MAY RESULT IN THE CANCELLATION OF YOUR SURGERY PATIENT SIGNATURE_________________________________  NURSE SIGNATURE__________________________________  ________________________________________________________________________

## 2022-10-03 NOTE — Progress Notes (Signed)
Anesthesia Review:  PCP: Cardiologist : Chest x-ray : EKG : Echo : Stress test: Cardiac Cath :  Activity level:  Sleep Study/ CPAP : Fasting Blood Sugar :      / Checks Blood Sugar -- times a day:   Blood Thinner/ Instructions /Last Dose: ASA / Instructions/ Last Dose :   Covid pos on 07/30/22.

## 2022-10-07 ENCOUNTER — Encounter (HOSPITAL_COMMUNITY)
Admission: RE | Admit: 2022-10-07 | Discharge: 2022-10-07 | Disposition: A | Discharge status: Home / Self Care | Payer: BC Managed Care – PPO | Source: Ambulatory Visit | Attending: Surgery | Admitting: Surgery

## 2022-10-07 ENCOUNTER — Other Ambulatory Visit: Payer: Self-pay

## 2022-10-07 ENCOUNTER — Encounter (HOSPITAL_COMMUNITY): Payer: Self-pay

## 2022-10-07 VITALS — BP 144/85 | HR 88 | Temp 98.5°F | Resp 16 | Ht 62.0 in | Wt 234.0 lb

## 2022-10-07 DIAGNOSIS — Z01818 Encounter for other preprocedural examination: Secondary | ICD-10-CM

## 2022-10-07 DIAGNOSIS — Z01812 Encounter for preprocedural laboratory examination: Secondary | ICD-10-CM | POA: Diagnosis not present

## 2022-10-07 HISTORY — DX: Other specified postprocedural states: Z98.890

## 2022-10-07 HISTORY — DX: Gastro-esophageal reflux disease without esophagitis: K21.9

## 2022-10-07 HISTORY — DX: Unspecified osteoarthritis, unspecified site: M19.90

## 2022-10-07 LAB — CBC
HCT: 39.3 % (ref 36.0–46.0)
Hemoglobin: 13.5 g/dL (ref 12.0–15.0)
MCH: 29.8 pg (ref 26.0–34.0)
MCHC: 34.4 g/dL (ref 30.0–36.0)
MCV: 86.8 fL (ref 80.0–100.0)
Platelets: 342 10*3/uL (ref 150–400)
RBC: 4.53 MIL/uL (ref 3.87–5.11)
RDW: 12.7 % (ref 11.5–15.5)
WBC: 9.6 10*3/uL (ref 4.0–10.5)
nRBC: 0 % (ref 0.0–0.2)

## 2022-10-27 ENCOUNTER — Ambulatory Visit (HOSPITAL_COMMUNITY)
Admission: RE | Admit: 2022-10-27 | Discharge: 2022-10-27 | Disposition: A | Discharge status: Home / Self Care | Payer: BC Managed Care – PPO | Source: Ambulatory Visit | Attending: Surgery | Admitting: Surgery

## 2022-10-27 ENCOUNTER — Encounter (HOSPITAL_COMMUNITY): Payer: Self-pay | Admitting: Surgery

## 2022-10-27 ENCOUNTER — Other Ambulatory Visit: Payer: Self-pay

## 2022-10-27 ENCOUNTER — Encounter (HOSPITAL_COMMUNITY)
Admission: RE | Disposition: A | Discharge status: Home / Self Care | Payer: Self-pay | Source: Ambulatory Visit | Attending: Surgery

## 2022-10-27 ENCOUNTER — Ambulatory Visit (HOSPITAL_COMMUNITY): Payer: BC Managed Care – PPO

## 2022-10-27 ENCOUNTER — Ambulatory Visit (HOSPITAL_COMMUNITY): Payer: BC Managed Care – PPO | Admitting: Anesthesiology

## 2022-10-27 ENCOUNTER — Ambulatory Visit (HOSPITAL_COMMUNITY): Payer: BC Managed Care – PPO | Admitting: Physician Assistant

## 2022-10-27 DIAGNOSIS — K8010 Calculus of gallbladder with chronic cholecystitis without obstruction: Secondary | ICD-10-CM | POA: Insufficient documentation

## 2022-10-27 DIAGNOSIS — I10 Essential (primary) hypertension: Secondary | ICD-10-CM | POA: Insufficient documentation

## 2022-10-27 DIAGNOSIS — K8020 Calculus of gallbladder without cholecystitis without obstruction: Secondary | ICD-10-CM | POA: Diagnosis not present

## 2022-10-27 DIAGNOSIS — Z01818 Encounter for other preprocedural examination: Secondary | ICD-10-CM

## 2022-10-27 DIAGNOSIS — K449 Diaphragmatic hernia without obstruction or gangrene: Secondary | ICD-10-CM | POA: Insufficient documentation

## 2022-10-27 DIAGNOSIS — K219 Gastro-esophageal reflux disease without esophagitis: Secondary | ICD-10-CM | POA: Diagnosis not present

## 2022-10-27 HISTORY — PX: CHOLECYSTECTOMY: SHX55

## 2022-10-27 LAB — POCT PREGNANCY, URINE: Preg Test, Ur: NEGATIVE

## 2022-10-27 SURGERY — LAPAROSCOPIC CHOLECYSTECTOMY
Anesthesia: General

## 2022-10-27 MED ORDER — BUPIVACAINE LIPOSOME 1.3 % IJ SUSP
INTRAMUSCULAR | Status: AC
  Filled 2022-10-27: qty 20

## 2022-10-27 MED ORDER — CEFAZOLIN SODIUM-DEXTROSE 2-4 GM/100ML-% IV SOLN
2.0000 g | INTRAVENOUS | Status: AC
  Administered 2022-10-27: 2 g via INTRAVENOUS
  Filled 2022-10-27: qty 100

## 2022-10-27 MED ORDER — OXYCODONE HCL 5 MG/5ML PO SOLN: 5.0000 mg | Freq: Once | ORAL | Status: AC | PRN

## 2022-10-27 MED ORDER — KETAMINE HCL 10 MG/ML IJ SOLN
INTRAMUSCULAR | Status: DC | PRN
  Administered 2022-10-27: 25 mg via INTRAVENOUS

## 2022-10-27 MED ORDER — FENTANYL CITRATE PF 50 MCG/ML IJ SOSY
PREFILLED_SYRINGE | INTRAMUSCULAR | Status: AC
  Filled 2022-10-27: qty 1

## 2022-10-27 MED ORDER — OXYCODONE HCL 5 MG PO TABS
5.0000 mg | ORAL_TABLET | Freq: Once | ORAL | Status: AC | PRN
  Administered 2022-10-27: 5 mg via ORAL

## 2022-10-27 MED ORDER — FENTANYL CITRATE PF 50 MCG/ML IJ SOSY
PREFILLED_SYRINGE | INTRAMUSCULAR | Status: AC
  Administered 2022-10-27: 25 ug via INTRAVENOUS
  Filled 2022-10-27: qty 2

## 2022-10-27 MED ORDER — OXYCODONE-ACETAMINOPHEN 5-325 MG PO TABS: 1.0000 | ORAL_TABLET | ORAL | 0 refills | Status: AC | PRN

## 2022-10-27 MED ORDER — PROPOFOL 10 MG/ML IV BOLUS
INTRAVENOUS | Status: DC | PRN
  Administered 2022-10-27: 200 mg via INTRAVENOUS

## 2022-10-27 MED ORDER — LACTATED RINGERS IV SOLN: INTRAVENOUS | Status: DC

## 2022-10-27 MED ORDER — BUPIVACAINE-EPINEPHRINE 0.5% -1:200000 IJ SOLN
INTRAMUSCULAR | Status: DC | PRN
  Administered 2022-10-27: 30 mL

## 2022-10-27 MED ORDER — SUGAMMADEX SODIUM 200 MG/2ML IV SOLN
INTRAVENOUS | Status: DC | PRN
  Administered 2022-10-27: 200 mg via INTRAVENOUS

## 2022-10-27 MED ORDER — DEXAMETHASONE SODIUM PHOSPHATE 10 MG/ML IJ SOLN
INTRAMUSCULAR | Status: DC | PRN
  Administered 2022-10-27: 5 mg via INTRAVENOUS

## 2022-10-27 MED ORDER — PHENYLEPHRINE 80 MCG/ML (10ML) SYRINGE FOR IV PUSH (FOR BLOOD PRESSURE SUPPORT)
PREFILLED_SYRINGE | INTRAVENOUS | Status: DC | PRN
  Administered 2022-10-27: 160 ug via INTRAVENOUS
  Administered 2022-10-27: 80 ug via INTRAVENOUS

## 2022-10-27 MED ORDER — MIDAZOLAM HCL 2 MG/2ML IJ SOLN
INTRAMUSCULAR | Status: AC
  Filled 2022-10-27: qty 2

## 2022-10-27 MED ORDER — OXYCODONE HCL 5 MG PO TABS
ORAL_TABLET | ORAL | Status: AC
  Filled 2022-10-27: qty 1

## 2022-10-27 MED ORDER — SCOPOLAMINE 1 MG/3DAYS TD PT72: 1.0000 | MEDICATED_PATCH | TRANSDERMAL | Status: DC

## 2022-10-27 MED ORDER — ROCURONIUM BROMIDE 10 MG/ML (PF) SYRINGE
PREFILLED_SYRINGE | INTRAVENOUS | Status: AC
  Filled 2022-10-27: qty 10

## 2022-10-27 MED ORDER — ONDANSETRON HCL 4 MG/2ML IJ SOLN
INTRAMUSCULAR | Status: AC
  Filled 2022-10-27: qty 2

## 2022-10-27 MED ORDER — ONDANSETRON HCL 4 MG/2ML IJ SOLN
INTRAMUSCULAR | Status: DC | PRN
  Administered 2022-10-27: 4 mg via INTRAVENOUS

## 2022-10-27 MED ORDER — IOHEXOL 300 MG/ML  SOLN
INTRAMUSCULAR | Status: DC | PRN
  Administered 2022-10-27: 10 mL

## 2022-10-27 MED ORDER — ACETAMINOPHEN 500 MG PO TABS
1000.0000 mg | ORAL_TABLET | ORAL | Status: AC
  Administered 2022-10-27: 1000 mg via ORAL
  Filled 2022-10-27: qty 2

## 2022-10-27 MED ORDER — MIDAZOLAM HCL 2 MG/2ML IJ SOLN
INTRAMUSCULAR | Status: DC | PRN
  Administered 2022-10-27: 2 mg via INTRAVENOUS

## 2022-10-27 MED ORDER — EPHEDRINE SULFATE-NACL 50-0.9 MG/10ML-% IV SOSY
PREFILLED_SYRINGE | INTRAVENOUS | Status: DC | PRN
  Administered 2022-10-27: 10 mg via INTRAVENOUS

## 2022-10-27 MED ORDER — LIDOCAINE 2% (20 MG/ML) 5 ML SYRINGE
INTRAMUSCULAR | Status: DC | PRN
  Administered 2022-10-27: 60 mg via INTRAVENOUS

## 2022-10-27 MED ORDER — DEXAMETHASONE SODIUM PHOSPHATE 10 MG/ML IJ SOLN
INTRAMUSCULAR | Status: AC
  Filled 2022-10-27: qty 1

## 2022-10-27 MED ORDER — ORAL CARE MOUTH RINSE: 15.0000 mL | Freq: Once | OROMUCOSAL | Status: AC

## 2022-10-27 MED ORDER — CHLORHEXIDINE GLUCONATE 0.12 % MT SOLN
15.0000 mL | Freq: Once | OROMUCOSAL | Status: AC
  Administered 2022-10-27: 15 mL via OROMUCOSAL

## 2022-10-27 MED ORDER — 0.9 % SODIUM CHLORIDE (POUR BTL) OPTIME
TOPICAL | Status: DC | PRN
  Administered 2022-10-27: 1000 mL

## 2022-10-27 MED ORDER — PHENYLEPHRINE 80 MCG/ML (10ML) SYRINGE FOR IV PUSH (FOR BLOOD PRESSURE SUPPORT)
PREFILLED_SYRINGE | INTRAVENOUS | Status: AC
  Filled 2022-10-27: qty 10

## 2022-10-27 MED ORDER — FENTANYL CITRATE PF 50 MCG/ML IJ SOSY
25.0000 ug | PREFILLED_SYRINGE | INTRAMUSCULAR | Status: DC | PRN
  Administered 2022-10-27: 25 ug via INTRAVENOUS
  Administered 2022-10-27 (×2): 50 ug via INTRAVENOUS

## 2022-10-27 MED ORDER — SCOPOLAMINE 1 MG/3DAYS TD PT72
MEDICATED_PATCH | TRANSDERMAL | Status: AC
  Administered 2022-10-27: 1.5 mg
  Filled 2022-10-27: qty 1

## 2022-10-27 MED ORDER — BUPIVACAINE LIPOSOME 1.3 % IJ SUSP: 20.0000 mL | Freq: Once | INTRAMUSCULAR | Status: DC

## 2022-10-27 MED ORDER — ROCURONIUM BROMIDE 10 MG/ML (PF) SYRINGE
PREFILLED_SYRINGE | INTRAVENOUS | Status: DC | PRN
  Administered 2022-10-27: 60 mg via INTRAVENOUS

## 2022-10-27 MED ORDER — LIDOCAINE HCL (PF) 2 % IJ SOLN
INTRAMUSCULAR | Status: AC
  Filled 2022-10-27: qty 5

## 2022-10-27 MED ORDER — KETOROLAC TROMETHAMINE 15 MG/ML IJ SOLN: 15.0000 mg | INTRAMUSCULAR | Status: DC

## 2022-10-27 MED ORDER — EPHEDRINE 5 MG/ML INJ
INTRAVENOUS | Status: AC
  Filled 2022-10-27: qty 5

## 2022-10-27 MED ORDER — CHLORHEXIDINE GLUCONATE CLOTH 2 % EX PADS: 6.0000 | MEDICATED_PAD | Freq: Once | CUTANEOUS | Status: DC

## 2022-10-27 MED ORDER — LACTATED RINGERS IR SOLN
Status: DC | PRN
  Administered 2022-10-27: 1000 mL

## 2022-10-27 MED ORDER — FENTANYL CITRATE (PF) 250 MCG/5ML IJ SOLN
INTRAMUSCULAR | Status: DC | PRN
  Administered 2022-10-27: 50 ug via INTRAVENOUS
  Administered 2022-10-27: 100 ug via INTRAVENOUS

## 2022-10-27 MED ORDER — GABAPENTIN 300 MG PO CAPS
300.0000 mg | ORAL_CAPSULE | ORAL | Status: AC
  Administered 2022-10-27: 300 mg via ORAL
  Filled 2022-10-27: qty 1

## 2022-10-27 MED ORDER — BUPIVACAINE-EPINEPHRINE (PF) 0.5% -1:200000 IJ SOLN
INTRAMUSCULAR | Status: AC
  Filled 2022-10-27: qty 30

## 2022-10-27 MED ORDER — FENTANYL CITRATE (PF) 250 MCG/5ML IJ SOLN
INTRAMUSCULAR | Status: AC
  Filled 2022-10-27: qty 5

## 2022-10-27 MED ORDER — PROPOFOL 10 MG/ML IV BOLUS
INTRAVENOUS | Status: AC
  Filled 2022-10-27: qty 20

## 2022-10-27 MED ORDER — SODIUM CHLORIDE (PF) 0.9 % IJ SOLN
INTRAMUSCULAR | Status: DC | PRN
  Administered 2022-10-27: 10 mL

## 2022-10-27 MED ORDER — ONDANSETRON HCL 4 MG/2ML IJ SOLN: 4.0000 mg | Freq: Four times a day (QID) | INTRAMUSCULAR | Status: DC | PRN

## 2022-10-27 MED ORDER — BUPIVACAINE LIPOSOME 1.3 % IJ SUSP
INTRAMUSCULAR | Status: DC | PRN
  Administered 2022-10-27: 20 mL

## 2022-10-27 SURGICAL SUPPLY — 45 items
ADH SKN CLS APL DERMABOND .7 (GAUZE/BANDAGES/DRESSINGS) ×1
APL PRP STRL LF DISP 70% ISPRP (MISCELLANEOUS) ×1
APPLIER CLIP ROT 10 11.4 M/L (STAPLE) ×1
APR CLP MED LRG 11.4X10 (STAPLE) ×1
BAG COUNTER SPONGE SURGICOUNT (BAG) IMPLANT
BAG SPEC RTRVL 10 TROC 200 (ENDOMECHANICALS) ×1
BAG SPNG CNTER NS LX DISP (BAG)
CABLE HIGH FREQUENCY MONO STRZ (ELECTRODE) ×2 IMPLANT
CATH URETL OPEN 5X70 (CATHETERS) IMPLANT
CHLORAPREP W/TINT 26 (MISCELLANEOUS) ×2 IMPLANT
CLIP APPLIE ROT 10 11.4 M/L (STAPLE) ×2 IMPLANT
COVER MAYO STAND XLG (MISCELLANEOUS) ×2 IMPLANT
COVER SURGICAL LIGHT HANDLE (MISCELLANEOUS) ×2 IMPLANT
DERMABOND ADVANCED .7 DNX12 (GAUZE/BANDAGES/DRESSINGS) ×2 IMPLANT
DRAPE C-ARM 42X120 X-RAY (DRAPES) IMPLANT
ELECT REM PT RETURN 15FT ADLT (MISCELLANEOUS) ×2 IMPLANT
ENDOLOOP SUT PDS II  0 18 (SUTURE) ×2
ENDOLOOP SUT PDS II 0 18 (SUTURE) ×2 IMPLANT
GLOVE BIO SURGEON STRL SZ7.5 (GLOVE) ×2 IMPLANT
GLOVE BIOGEL PI IND STRL 8 (GLOVE) ×2 IMPLANT
GOWN STRL REUS W/ TWL XL LVL3 (GOWN DISPOSABLE) ×4 IMPLANT
GOWN STRL REUS W/TWL XL LVL3 (GOWN DISPOSABLE) ×2
GRASPER SUT TROCAR 14GX15 (MISCELLANEOUS) IMPLANT
HEMOSTAT SNOW SURGICEL 2X4 (HEMOSTASIS) IMPLANT
IRRIG SUCT STRYKERFLOW 2 WTIP (MISCELLANEOUS) ×1
IRRIGATION SUCT STRKRFLW 2 WTP (MISCELLANEOUS) ×2 IMPLANT
IV CATH 14GX2 1/4 (CATHETERS) ×2 IMPLANT
KIT BASIN OR (CUSTOM PROCEDURE TRAY) ×2 IMPLANT
KIT TURNOVER KIT A (KITS) IMPLANT
NDL INSUFFLATION 14GA 120MM (NEEDLE) ×2 IMPLANT
NEEDLE INSUFFLATION 14GA 120MM (NEEDLE) ×1 IMPLANT
PENCIL SMOKE EVACUATOR (MISCELLANEOUS) IMPLANT
POUCH RETRIEVAL ECOSAC 10 (ENDOMECHANICALS) ×2 IMPLANT
POUCH RETRIEVAL ECOSAC 10MM (ENDOMECHANICALS) ×1
SCISSORS LAP 5X35 DISP (ENDOMECHANICALS) ×2 IMPLANT
SET TUBE SMOKE EVAC HIGH FLOW (TUBING) ×2 IMPLANT
SLEEVE Z-THREAD 5X100MM (TROCAR) ×4 IMPLANT
SPIKE FLUID TRANSFER (MISCELLANEOUS) ×2 IMPLANT
STOPCOCK 4 WAY LG BORE MALE ST (IV SETS) IMPLANT
SUT MNCRL AB 4-0 PS2 18 (SUTURE) ×2 IMPLANT
TOWEL OR 17X26 10 PK STRL BLUE (TOWEL DISPOSABLE) ×2 IMPLANT
TOWEL OR NON WOVEN STRL DISP B (DISPOSABLE) IMPLANT
TRAY LAPAROSCOPIC (CUSTOM PROCEDURE TRAY) ×2 IMPLANT
TROCAR ADV FIXATION 12X100MM (TROCAR) ×2 IMPLANT
TROCAR Z-THREAD OPTICAL 5X100M (TROCAR) ×2 IMPLANT

## 2022-10-27 NOTE — H&P (Signed)
Admitting Physician: Hyman Hopes Kiandre Spagnolo  Service: General Surgery  CC: Abdominal pain  Subjective   HPI: Kristi Wu is an 41 y.o. female who is here for cholecystectomy  Past Medical History:  Diagnosis Date   Anxiety    Arthritis    Asthma    used 03/04/19   Complication of anesthesia    asthma attack during endoscopy   Depression    Ectopic pregnancy    GERD (gastroesophageal reflux disease)    Headache(784.0)    History of hiatal hernia    Hypertension    Low iron    PONV (postoperative nausea and vomiting)    Pregnancy induced hypertension     Past Surgical History:  Procedure Laterality Date   COLPOSCOPY VULVA     PLANTAR FASCIA SURGERY     UPPER GI ENDOSCOPY     vulvoscopy      WISDOM TOOTH EXTRACTION  2005    Family History  Problem Relation Age of Onset   Asthma Mother    Hypertension Father    Thyroid disease Father    Emphysema Maternal Grandmother    Cancer Maternal Grandmother        Breast   Breast cancer Maternal Grandmother    Diabetes Maternal Grandfather    Diabetes Paternal Grandfather    Emphysema Paternal Grandfather    Kidney disease Paternal Grandfather     Social:  reports that she has never smoked. She has never used smokeless tobacco. She reports current alcohol use. She reports that she does not use drugs.  Allergies:  Allergies  Allergen Reactions   Doxycycline Hives   Amoxicillin Rash   Penicillins Rash    Did it involve swelling of the face/tongue/throat, SOB, or low BP? Unknown Did it involve sudden or severe rash/hives, skin peeling, or any reaction on the inside of your mouth or nose? Unknown Did you need to seek medical attention at a hospital or doctor's office? Unknown When did it last happen?      childhood reaction If all above answers are "NO", may proceed with cephalosporin use.    Sulfa Antibiotics Rash    Medications: Current Outpatient Medications  Medication Instructions   albuterol  (PROVENTIL HFA;VENTOLIN HFA) 108 (90 Base) MCG/ACT inhaler 1-2 puffs, Inhalation, Every 6 hours PRN   Cholecalciferol 50,000 Units, Oral, Every Sun   norethindrone (MICRONOR) 0.35 MG tablet 1 tablet, Oral, Daily at bedtime   ondansetron (ZOFRAN-ODT) 4 mg, Oral, Every 8 hours PRN   sertraline (ZOLOFT) 100 mg, Oral, Daily at bedtime   triamcinolone cream (KENALOG) 0.1 % 1 Application, Topical, 2 times daily PRN    ROS - all of the below systems have been reviewed with the patient and positives are indicated with bold text General: chills, fever or night sweats Eyes: blurry vision or double vision ENT: epistaxis or sore throat Allergy/Immunology: itchy/watery eyes or nasal congestion Hematologic/Lymphatic: bleeding problems, blood clots or swollen lymph nodes Endocrine: temperature intolerance or unexpected weight changes Breast: new or changing breast lumps or nipple discharge Resp: cough, shortness of breath, or wheezing CV: chest pain or dyspnea on exertion GI: as per HPI GU: dysuria, trouble voiding, or hematuria MSK: joint pain or joint stiffness Neuro: TIA or stroke symptoms Derm: pruritus and skin lesion changes Psych: anxiety and depression  Objective   PE unknown if currently breastfeeding. Constitutional: NAD; conversant; no deformities Eyes: Moist conjunctiva; no lid lag; anicteric; PERRL Neck: Trachea midline; no thyromegaly Lungs: Normal respiratory effort; no tactile  fremitus CV: RRR; no palpable thrills; no pitting edema GI: Abd Soft, nontender; no palpable hepatosplenomegaly MSK: Normal range of motion of extremities; no clubbing/cyanosis Psychiatric: Appropriate affect; alert and oriented x3 Lymphatic: No palpable cervical or axillary lymphadenopathy  No results found for this or any previous visit (from the past 24 hour(s)).  Imaging Orders  No imaging studies ordered today   LFTs elevated per GI note - unable to review labs from their system  Old RUQ [place]  12/17/16 IMPRESSION: Hepatic fatty infiltration, otherwise negative exam.  Narrative  TECHNIQUE: Realtime multiplanar grayscale and limited color Doppler ultrasound of the right upper quadrant was performed.   COMPARISON: None.   INDICATION: R10.9: Unspecified abdominal pain   FINDINGS: The gallbladder and biliary tree appear within normal limits with common duct measuring 3 mm. Hepatic fatty infiltration without focal mass. The pancreas, right kidney, aorta and IVC are unremarkable. Normal portal vein flow is present.   CT abd/pel 06/18/21 1. There is wall thickening and fat stranding about the proximal to mid sigmoid colon, a segment of colon with several diverticula. Findings are generally consistent with acute diverticulitis. No evidence of perforation or abscess. 2. There is additional wall thickening and mural stratification of the decompressed cecum and transverse colon, without overt acute inflammatory findings. This appearance may reflect sequelae of prior colitis, potentially including inflammatory bowel disease such as Crohn's disease or ulcerative colitis. 3. Nonobstructive right nephrolithiasis. 4. Cholelithiasis.   Assessment and Plan   Kristi Wu is an 41 y.o. female with gallstones.  [name] has gallstones and a history of biliary colic type symptoms and elevated LFTs. I recommended laparoscopic cholecystectomy with intraoperative cholangiogram. The procedure itself as well as its risk, benefits, and alternatives were discussed with the patient in full. After full discussion all questions answered the patient granted consent to proceed.    Quentin Ore, MD  [facility] Use [email or web address] to contact on call provider

## 2022-10-27 NOTE — Anesthesia Procedure Notes (Signed)
Procedure Name: Intubation Date/Time: 10/27/2022 1:21 PM  Performed by: Elyn Peers, CRNAPre-anesthesia Checklist: Patient identified, Emergency Drugs available, Suction available, Patient being monitored and Timeout performed Patient Re-evaluated:Patient Re-evaluated prior to induction Oxygen Delivery Method: Circle system utilized Preoxygenation: Pre-oxygenation with 100% oxygen Induction Type: IV induction Ventilation: Mask ventilation without difficulty Laryngoscope Size: Miller and 3 Grade View: Grade I Tube type: Oral Tube size: 7.0 mm Number of attempts: 1 Airway Equipment and Method: Stylet Placement Confirmation: ETT inserted through vocal cords under direct vision, positive ETCO2 and breath sounds checked- equal and bilateral Secured at: 22 cm Tube secured with: Tape Dental Injury: Teeth and Oropharynx as per pre-operative assessment

## 2022-10-27 NOTE — Anesthesia Postprocedure Evaluation (Signed)
Anesthesia Post Note  Patient: Kristi Wu  Procedure(s) Performed: LAPAROSCOPIC CHOLECYSTECTOMY WITH INTRAOPERATIVE CHOLANGIOGRAM     Patient location during evaluation: PACU Anesthesia Type: General Level of consciousness: awake and alert Pain management: pain level controlled Vital Signs Assessment: post-procedure vital signs reviewed and stable Respiratory status: spontaneous breathing, nonlabored ventilation, respiratory function stable and patient connected to nasal cannula oxygen Cardiovascular status: blood pressure returned to baseline and stable Postop Assessment: no apparent nausea or vomiting Anesthetic complications: no   No notable events documented.  Last Vitals:  Vitals:   10/27/22 1600 10/27/22 1615  BP: 108/66 114/73  Pulse: 81 70  Resp: 16 14  Temp: 36.7 C 36.7 C  SpO2: 92% 92%    Last Pain:  Vitals:   10/27/22 1615  TempSrc:   PainSc: 4                  [name]

## 2022-10-27 NOTE — Transfer of Care (Signed)
Immediate Anesthesia Transfer of Care Note  Patient: Kristi Wu  Procedure(s) Performed: LAPAROSCOPIC CHOLECYSTECTOMY WITH INTRAOPERATIVE CHOLANGIOGRAM  Patient Location: PACU  Anesthesia Type:General  Level of Consciousness: awake, drowsy, and responds to stimulation  Airway & Oxygen Therapy: Patient Spontanous Breathing and Patient connected to face mask oxygen  Post-op Assessment: Report given to RN and Post -op Vital signs reviewed and stable  Post vital signs: Reviewed and stable  Last Vitals:  Vitals Value Taken Time  BP 115/60 10/27/22 1426  Temp    Pulse 82 10/27/22 1428  Resp 23 10/27/22 1428  SpO2 92 % 10/27/22 1428  Vitals shown include unvalidated device data.  Last Pain:  Vitals:   10/27/22 1129  TempSrc: Oral         Complications: No notable events documented.

## 2022-10-27 NOTE — Anesthesia Preprocedure Evaluation (Signed)
Anesthesia Evaluation  Patient identified by MRN, date of birth, ID band Patient awake    Reviewed: Allergy & Precautions, H&P , NPO status , Patient's Chart, lab work & pertinent test results  History of Anesthesia Complications (+) PONV and history of anesthetic complications  Airway Mallampati: II   Neck ROM: full    Dental   Pulmonary asthma    breath sounds clear to auscultation       Cardiovascular hypertension,  Rhythm:regular Rate:Normal     Neuro/Psych  Headaches PSYCHIATRIC DISORDERS Anxiety Depression       GI/Hepatic hiatal hernia,GERD  ,,  Endo/Other    Renal/GU      Musculoskeletal  (+) Arthritis ,    Abdominal   Peds  Hematology   Anesthesia Other Findings   Reproductive/Obstetrics                             Anesthesia Physical Anesthesia Plan  ASA: 2  Anesthesia Plan: General   Post-op Pain Management:    Induction: Intravenous  PONV Risk Score and Plan: 4 or greater and Ondansetron, Dexamethasone, Midazolam and Treatment may vary due to age or medical condition  Airway Management Planned: Oral ETT  Additional Equipment:   Intra-op Plan:   Post-operative Plan: Extubation in OR  Informed Consent: I have reviewed the patients History and Physical, chart, labs and discussed the procedure including the risks, benefits and alternatives for the proposed anesthesia with the patient or authorized representative who has indicated his/her understanding and acceptance.     Dental advisory given  Plan Discussed with: CRNA, Anesthesiologist and Surgeon  Anesthesia Plan Comments:        Anesthesia Quick Evaluation

## 2022-10-27 NOTE — Discharge Instructions (Signed)
 CHOLECYSTECTOMY POST OPERATIVE INSTRUCTIONS  Thinking Clearly  The anesthesia may cause you to feel different for 1 or 2 days. Do not drive, drink alcohol, or make any big decisions for at least 2 days.  Nutrition When you wake up, you will be able to drink small amounts of liquid. If you do not feel sick, you can slowly advance your diet to regular foods. Continue to drink lots of fluids, usually about 8 to 10 glasses per day. Eat a high-fiber diet so you don't strain during bowel movements. High-Fiber Foods Foods high in fiber include beans, bran cereals and whole-grain breads, peas, dried fruit (figs, apricots, and dates), raspberries, blackberries, strawberries, sweet corn, broccoli, baked potatoes with skin, plums, pears, apples, greens, and nuts. Activity Slowly increase your activity. Be sure to get up and walk every hour or so to prevent blood clots. No heavy lifting or strenuous activity for 4 weeks following surgery to prevent hernias at your incision sites It is normal to feel tired. You may need more sleep than usual.  Get your rest but make sure to get up and move around frequently to prevent blood clots and pneumonia.  Work and Return to School You can go back to work when you feel well enough. Discuss the timing with your surgeon. You can usually go back to school or work 1 week after an operation. If your work requires heavy lifting or strenuous activity you need to be placed on light duty for 4 weeks following surgery. You can return to gym class, sports or other physical activities 4 weeks after surgery.  Wound Care Always wash your hands before and after touching near your incision site. Do not soak in a bathtub until cleared at your follow up appointment. You may take a shower 24 hours after surgery. A small amount of drainage from the incision is normal. If the drainage is thick and yellow or the site is red, you may have an infection, so call your surgeon. If you  have a drain in one of your incisions, it will be taken out in office when the drainage stops. Steri-Strips will fall off in 7 to 10 days or they will be removed during your first office visit. If you have dermabond glue covering over the incision, allow the glue to flake off on its own. Avoid wearing tight or rough clothing. It may rub your incisions and make it harder for them to heal. Protect the new skin, especially from the sun. The sun can burn and cause darker scarring. Your scar will heal in about 4 to 6 weeks and will become softer and continue to fade over the next year.  The cosmetic appearance of the incisions will improve over the course of the first year after surgery. Sensation around your incision will return in a few weeks or months.  Bowel Movements After intestinal surgery, you may have loose watery stools for several days. If watery diarrhea lasts longer than 3 days, contact your surgeon. Pain medication (narcotics) can cause constipation. Increase the fiber in your diet with high-fiber foods if you are constipated. You can take an over the counter stool softener like Colace to avoid constipation.  Additional over the counter medications can also be used if Colace isn't sufficient (for example, Milk of Magnesia or Miralax).  Pain The amount of pain is different for each person. Some people need only 1 to 3 doses of pain control medication, while others need more. Take alternating doses of tylenol   and ibuprofen around the clock for the first five days following surgery.  This will provide a baseline of pain control and help with inflammation.  Take the narcotic pain medication in addition if needed for severe pain.  Contact Your Surgeon at [phone], if you have: Pain in your right upper abdomen like a gallbladder attack. Pain that will not go away Pain that gets worse A fever of more than 101F (38.3C) Repeated vomiting Swelling, redness, bleeding, or bad-smelling  drainage from your wound site Strong abdominal pain No bowel movement or unable to pass gas for 3 days Watery diarrhea lasting longer than 3 days  Pain Control The goal of pain control is to minimize pain, keep you moving and help you heal. Your surgical team will work with you on your pain plan. Most often a combination of therapies and medications are used to control your pain. You may also be given medication (local anesthetic) at the surgical site. This may help control your pain for several days. Extreme pain puts extra stress on your body at a time when your body needs to focus on healing. Do not wait until your pain has reached a level "10" or is unbearable before telling your doctor or nurse. It is much easier to control pain before it becomes severe. Following a laparoscopic procedure, pain is sometimes felt in the shoulder. This is due to the gas inserted into your abdomen during the procedure. Moving and walking helps to decrease the gas and the right shoulder pain.  Use the guide below for ways to manage your post-operative pain. Learn more by going to facs.org/safepaincontrol.  How Intense Is My Pain Common Therapies to Feel Better       I hardly notice my pain, and it does not interfere with my activities.  I notice my pain and it distracts me, but I can still do activities (sitting up, walking, standing).  Non-Medication Therapies  Ice (in a bag, applied over clothing at the surgical site), elevation, rest, meditation, massage, distraction (music, TV, play) walking and mild exercise Splinting the abdomen with pillows +  Non-Opioid Medications Acetaminophen (Tylenol) Non-steroidal anti-inflammatory drugs (NSAIDS) Aspirin, Ibuprofen (Motrin, Advil) Naproxen (Aleve) Take these as needed, when you feel pain. Both acetaminophen and NSAIDs help to decrease pain and swelling (inflammation).      My pain is hard to ignore and is more noticeable even when I rest.  My  pain interferes with my usual activities.  Non-Medication Therapies  +  Non-Opioid medications  Take on a regular schedule (around-the-clock) instead of as needed. (For example, Tylenol every 6 hours at 9:00 am, 3:00 pm, 9:00 pm, 3:00 am and Motrin every 6 hours at 12:00 am, 6:00 am, 12:00 pm, 6:00 pm)         I am focused on my pain, and I am not doing my daily activities.  I am groaning in pain, and I cannot sleep. I am unable to do anything.  My pain is as bad as it could be, and nothing else matters.  Non-Medication Therapies  +  Around-the-Clock Non-Opioid Medications  +  Short-acting opioids  Opioids should be used with other medications to manage severe pain. Opioids block pain and give a feeling of euphoria (feel high). Addiction, a serious side effect of opioids, is rare with short-term (a few days) use.  Examples of short-acting opioids include: Tramadol (Ultram), Hydrocodone (Norco, Vicodin), Hydromorphone (Dilaudid), Oxycodone (Oxycontin)     The above directions have been adapted from   the American [employer] Surgical Patient Education Program.  Please refer to the ACS website if needed: https://www.facs.org/-/media/files/education/patient-ed/cholesys.ashx.   Julya Alioto, MD [facility] [address], [address], [place], [place]  [address] ?  [address] [address], [place], [place]   [address] [phone] ? [phone] ? FAX [phone] Web site: [email or web address]  

## 2022-10-27 NOTE — Op Note (Signed)
Patient: Kristi Wu (August 12, 1981, [id number])  Date of Surgery: 10/27/2022   Preoperative Diagnosis: CHOLELITHIASIS   Postoperative Diagnosis: CHOLELITHIASIS   Surgical Procedure: LAPAROSCOPIC CHOLECYSTECTOMY WITH INTRAOPERATIVE CHOLANGIOGRAM:    Operative Team Members:  Surgeon(s) and Role:    * Jadier Rockers, Hyman Hopes, MD - Primary    * Berna Bue, MD - Assisting   Anesthesiologist: Achille Rich, MD CRNA: Elyn Peers, CRNA; Orest Dikes, CRNA   Anesthesia: General   Fluids:  Total I/O In: 1100 [I.V.:1000; IV Piggyback:100] Out: 5 [Blood:5]  Complications: * No complications entered in OR log *  Drains:  none   Specimen:  ID Type Source Tests Collected by Time Destination  1 : Gallbladder Tissue PATH Gallbladder SURGICAL PATHOLOGY Jamilett Ferrante, Hyman Hopes, MD 10/27/2022 1356      Disposition:  PACU - hemodynamically stable.  Plan of Care: Discharge to home after PACU    Indications for Procedure: Gursimran Litaker is a 41 y.o. female who presented with gallstones, biliary colic and elevated LFTs.  History, physical and imaging was concerning for cholecystitis.  Laparoscopic cholecystectomy was recommended for the patient.  The procedure itself, as well as the risks, benefits and alternatives were discussed with the patient.  Risks discussed included but were not limited to the risk of infection, bleeding, damage to nearby structures, need to convert to open procedure, incisional hernia, bile leak, common bile duct injury and the need for additional procedures or surgeries.  With this discussion complete and all questions answered the patient granted consent to proceed.  Findings: Normal cholangiogram  Infection status: Patient: Private Patient Elective Case Case: Elective Infection Present At Time Of Surgery (PATOS): None   Description of Procedure:   On the date stated above, the patient was taken to the operating room suite and placed  in supine positioning.  Sequential compression devices were placed on the lower extremities to prevent blood clots.  General endotracheal anesthesia was induced. Preoperative antibiotics were given.  The patient's abdomen was prepped and draped in the usual sterile fashion.  A time-out was completed verifying the correct patient, procedure, positioning and equipment needed for the case.  We began by anesthetizing the skin with local anesthetic and then making a 5 mm incision just below the umbilicus.  We dissected through the subcutaneous tissues to the fascia.  The fascia was grasped and elevated using a Kocher clamp.  A Veress needle was inserted into the abdomen and the abdomen was insufflated to 15 mmHg.  A 5 mm trocar was inserted in this position under optical guidance and then the abdomen was inspected.  There was no trauma to the underlying viscera with initial trocar placement.  Any abnormal findings, other than inflammation in the right upper quadrant, are listed above in the findings section.  Three additional trocars were placed, one 12 mm trocar in the subxiphoid position, one 5 mm trocar in the midline epigastric area and one 50mm trocar in the right upper quadrant subcostally.  These were placed under direct vision without any trauma to the underlying viscera.    The patient was then placed in head up, left side down positioning.  The gallbladder was identified and dissected free from its attachments to the omentum allowing the duodenum to fall away.  The infundibulum of the gallbladder was dissected free working laterally to medially.  The cystic duct and cystic artery were dissected free from surrounding connective tissue.  The infundibulum of the gallbladder was dissected off the cystic  plate.  A critical view of safety was obtained with the cystic duct and cystic artery being cleared of connective tissues and clearly the only two structures entering into the gallbladder with the liver clearly  visible behind.  One clip was applied high on the cystic duct.  A small ductotomy was created below this using the endoscopic shears.  A cholangiogram catheter was introduced through the abdominal wall and into the cystic duct through this ductotomy.  The catheter was clipped into position.  The catheter was flushed to ensure no leakage around the clip.  We then removed the laparoscopic instruments and positioned the C-Arm to perform a cholangiogram.  The catheter was flushed with contrast under fluoroscopic visualization and a cholangiogram was obtained.  The cholangiogram visualized the biliary tree from the ampulla up to the first two biliary radicals in the liver.  There were no filling defects identified.  The catheter clearly entered the cystic duct.  There was gradual tapering of the common bile duct down to the ampulla without evidence of stricture or other abnormalities.  Some contrast leaked during cholangiogram likely from choledochotomy site. Please see the EMR for saved representative images.  With our cholangiogram compete, we moved the c-arm away from the field and returned to laparoscopic surgery.    Clips were then applied to the cystic duct and cystic artery and then these structures were divided.  A PDS endoloop was placed to secure the cystic duct.  The gallbladder was dissected off the cystic plate, placed in an endocatch bag and removed from the 12 mm subxiphoid port site.  The clips were inspected and appeared effective.  The cystic plate was inspected and hemostasis was obtained using electrocautery.  A suction irrigator was used to clean the operative field.  Attention was turned to closure.  The 12 mm subxiphoid port site was closed using a 0-vicryl suture on a fascial suture passer.  The abdomen was desufflated.  The skin was closed using 4-0 monocryl and dermabond.  All sponge and needle counts were correct at the conclusion of the case.    Ivar Drape, MD General, Bariatric,  & Minimally Invasive Surgery [facility], [place]

## 2022-10-28 ENCOUNTER — Encounter (HOSPITAL_COMMUNITY): Payer: Self-pay | Admitting: Surgery

## 2022-10-28 LAB — SURGICAL PATHOLOGY

## 2022-12-11 DIAGNOSIS — E559 Vitamin D deficiency, unspecified: Secondary | ICD-10-CM | POA: Diagnosis not present

## 2022-12-11 DIAGNOSIS — F324 Major depressive disorder, single episode, in partial remission: Secondary | ICD-10-CM | POA: Diagnosis not present

## 2022-12-11 DIAGNOSIS — Z23 Encounter for immunization: Secondary | ICD-10-CM | POA: Diagnosis not present

## 2022-12-11 DIAGNOSIS — J4520 Mild intermittent asthma, uncomplicated: Secondary | ICD-10-CM | POA: Diagnosis not present

## 2022-12-11 DIAGNOSIS — G43009 Migraine without aura, not intractable, without status migrainosus: Secondary | ICD-10-CM | POA: Diagnosis not present

## 2023-03-13 IMAGING — CT CT ABD-PELV W/O CM
1 of 2 series · 13 of 32 positions shown, 18 images · non-contrast
Comparison: None.

CLINICAL DATA: Left lower quadrant pain for 2 days, evaluate for
diverticulitis

EXAM:
CT ABDOMEN AND PELVIS WITHOUT CONTRAST
TECHNIQUE: Multidetector CT imaging of the abdomen and pelvis was performed
following the standard protocol without IV contrast.

[Series 2: abd/pelvis w/(date) · axial · 0.88mm/px · z∈[+708,+1163]mm · 13 of 105 slices shown, 18 images]
[im 7/105  soft-tissue]
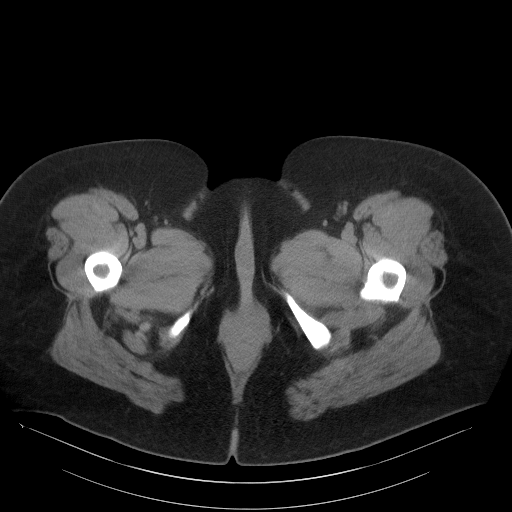
[im 7/105  bone]
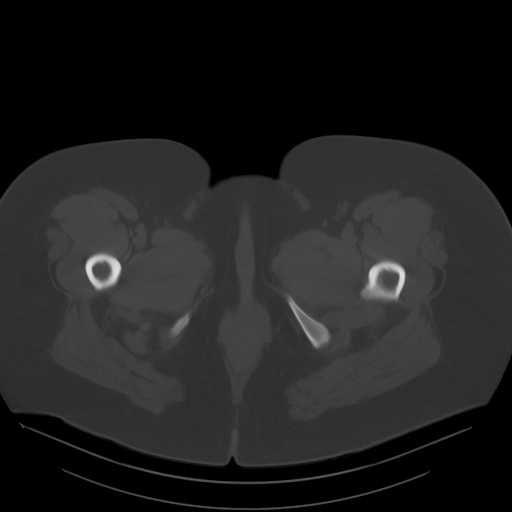
[im 19/105  soft-tissue]
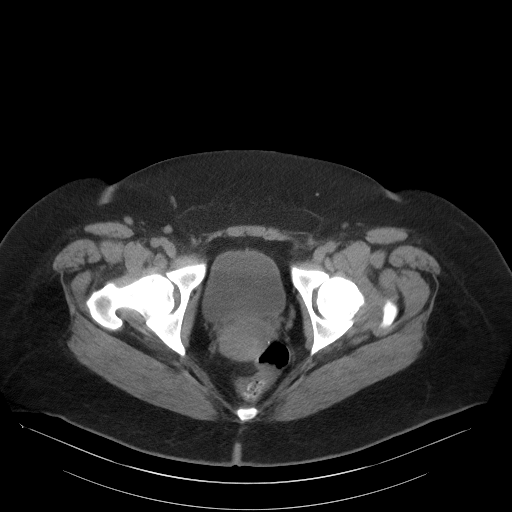
[im 25/105  soft-tissue]
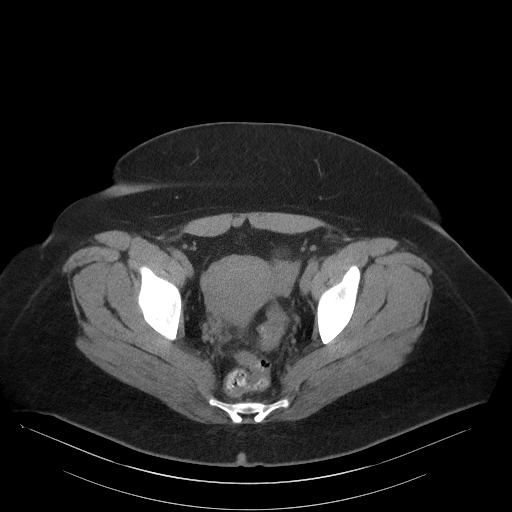
[im 31/105  soft-tissue]
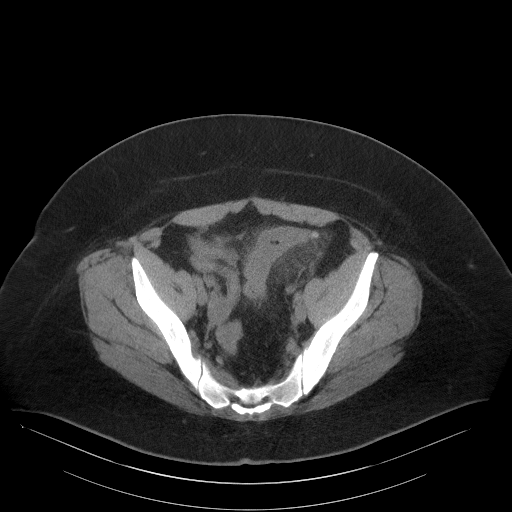
[im 43/105  soft-tissue]
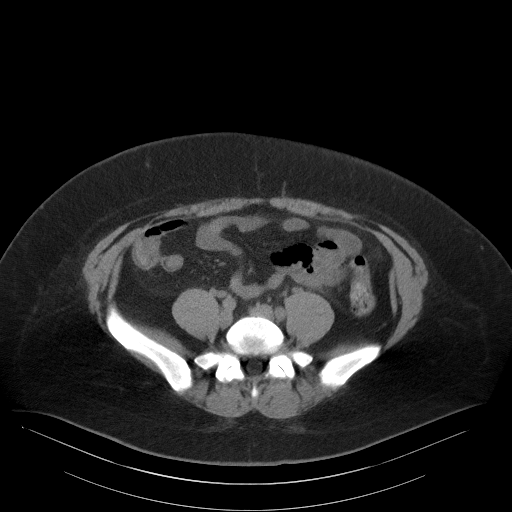
[im 49/105  soft-tissue]
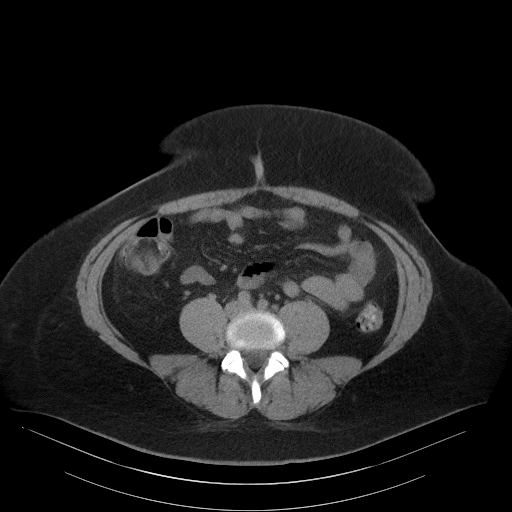
[im 56/105  soft-tissue]
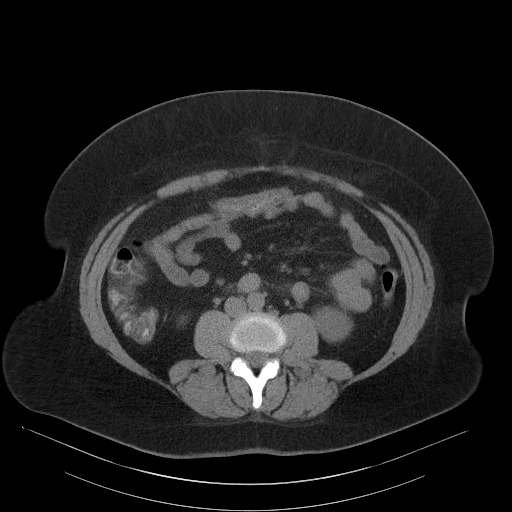
[im 68/105  soft-tissue]
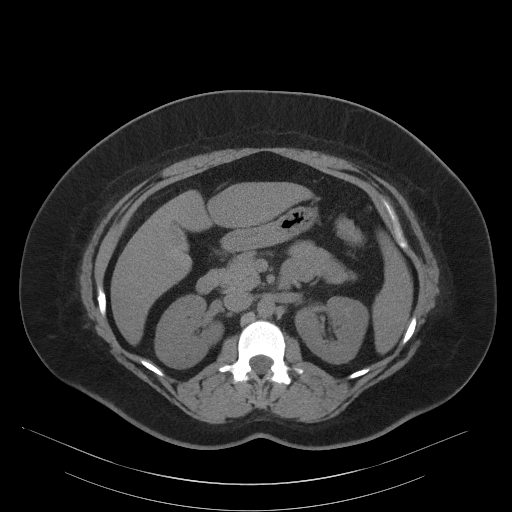
[im 74/105  soft-tissue]
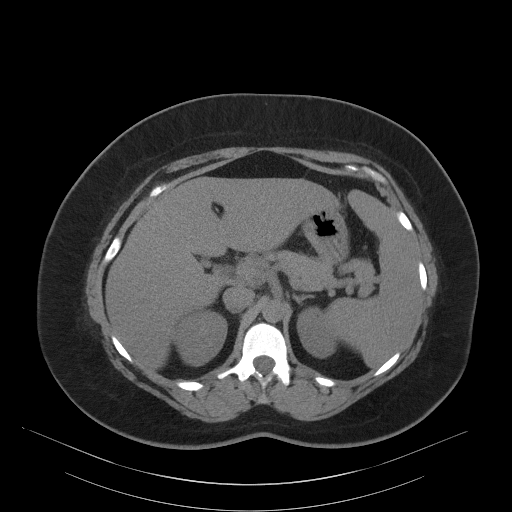
[im 74/105  bone]
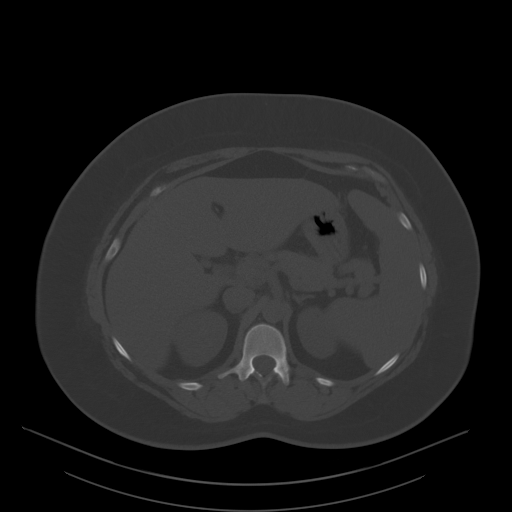
[im 80/105  soft-tissue]
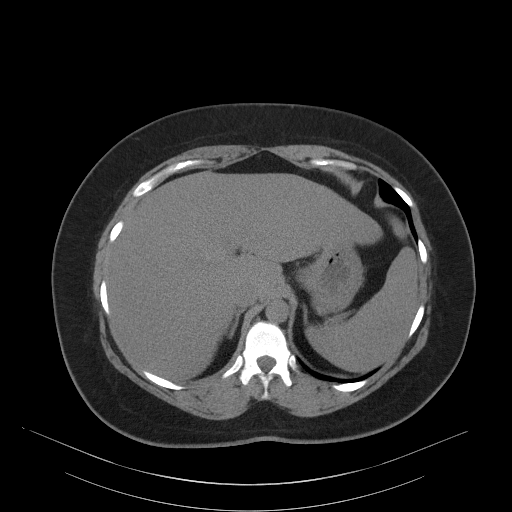
[im 80/105  lung]
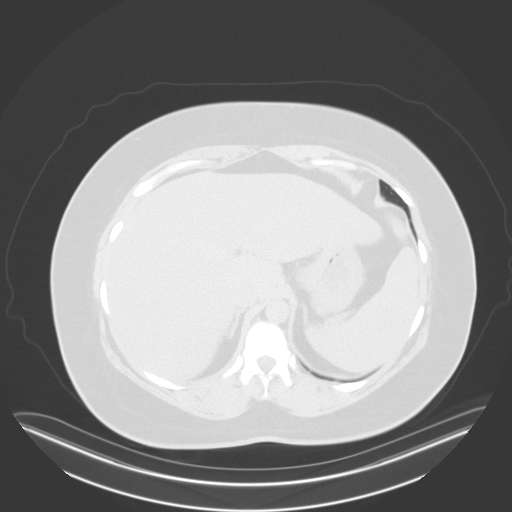
[im 86/105  lung]
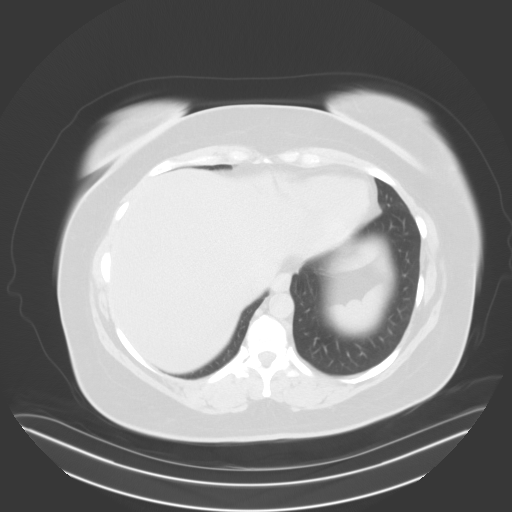
[im 92/105  soft-tissue]
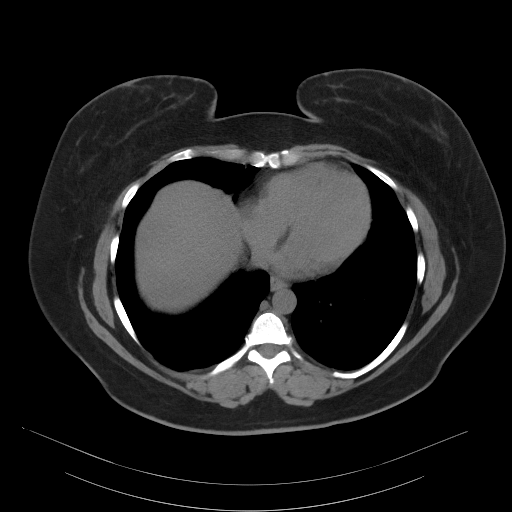
[im 92/105  lung]
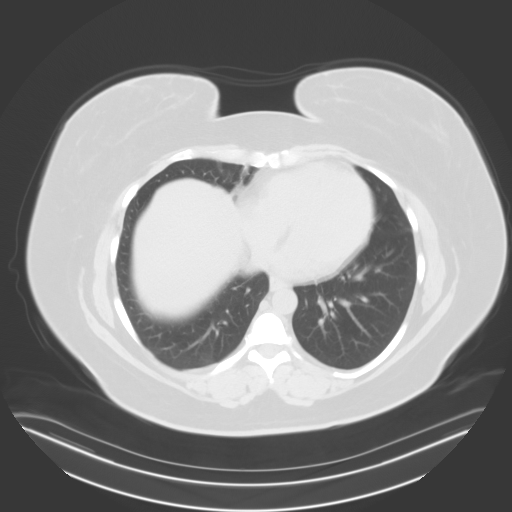
[im 98/105  soft-tissue]
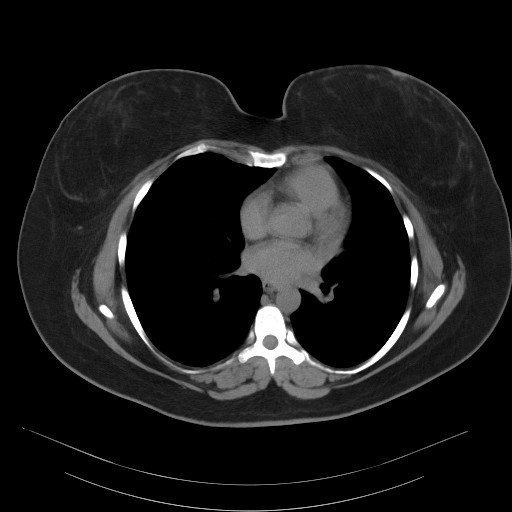
[im 98/105  lung]
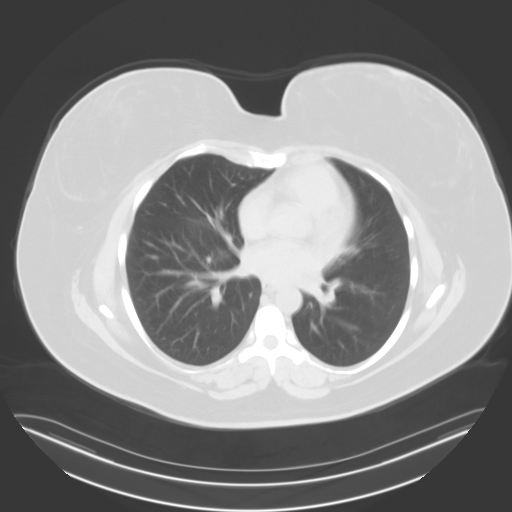

[13 of 32 positions shown; findings below may reference images not displayed]

FINDINGS: Lower chest: No acute abnormality.

Hepatobiliary: No solid liver abnormality is seen. Gallstone
contracted in the gallbladder. Gallbladder wall thickening, or
biliary dilatation.

Pancreas: Unremarkable. No pancreatic ductal dilatation or
surrounding inflammatory changes.

Spleen: Normal in size without significant abnormality.

Adrenals/Urinary Tract: Adrenal glands are unremarkable. Small
nonobstructive calculus of the lateral midportion of the right
kidney. Bladder is unremarkable.

Stomach/Bowel: Stomach is within normal limits. Appendix appears
normal. Occasional sigmoid diverticula. There is wall thickening and
fat stranding about the proximal to mid sigmoid colon, a segment of
colon with several diverticula diverticula (series 2, image 76).
There is additional wall thickening and mural stratification of the
decompressed cecum and transverse colon (series 2, image 48).

Vascular/Lymphatic: No significant vascular findings are present. No
enlarged abdominal or pelvic lymph nodes.

Reproductive: No mass or other significant abnormality.

Other: No abdominal wall hernia or abnormality. No abdominopelvic
ascites.

Musculoskeletal: No acute or significant osseous findings.
IMPRESSION: 1. There is wall thickening and fat stranding about the proximal to
mid sigmoid colon, a segment of colon with several diverticula.
Findings are generally consistent with acute diverticulitis. No
evidence of perforation or abscess.
2. There is additional wall thickening and mural stratification of
the decompressed cecum and transverse colon, without overt acute
inflammatory findings. This appearance may reflect sequelae of prior
colitis, potentially including inflammatory bowel disease such as
Crohn's disease or ulcerative colitis.
3. Nonobstructive right nephrolithiasis.
4. Cholelithiasis.

These results will be called to the ordering clinician or
representative by the Radiologist Assistant, and communication
documented in the PACS or [REDACTED].

## 2023-04-07 DIAGNOSIS — J029 Acute pharyngitis, unspecified: Secondary | ICD-10-CM | POA: Diagnosis not present

## 2023-04-07 DIAGNOSIS — Z681 Body mass index (BMI) 19 or less, adult: Secondary | ICD-10-CM | POA: Diagnosis not present

## 2023-11-11 DIAGNOSIS — F418 Other specified anxiety disorders: Secondary | ICD-10-CM | POA: Diagnosis not present

## 2023-11-11 DIAGNOSIS — Z01419 Encounter for gynecological examination (general) (routine) without abnormal findings: Secondary | ICD-10-CM | POA: Diagnosis not present

## 2023-11-11 DIAGNOSIS — Z1231 Encounter for screening mammogram for malignant neoplasm of breast: Secondary | ICD-10-CM | POA: Diagnosis not present

## 2023-11-11 DIAGNOSIS — Z3040 Encounter for surveillance of contraceptives, unspecified: Secondary | ICD-10-CM | POA: Diagnosis not present

## 2023-12-01 DIAGNOSIS — R928 Other abnormal and inconclusive findings on diagnostic imaging of breast: Secondary | ICD-10-CM | POA: Diagnosis not present

## 2023-12-01 DIAGNOSIS — N6489 Other specified disorders of breast: Secondary | ICD-10-CM | POA: Diagnosis not present

## 2024-07-04 ENCOUNTER — Ambulatory Visit: Admitting: Physician Assistant

## 2024-07-04 DIAGNOSIS — Z91199 Patient's noncompliance with other medical treatment and regimen due to unspecified reason: Secondary | ICD-10-CM

## 2024-07-04 NOTE — Progress Notes (Signed)
 Patient no-showed today's appointment.

## 2024-09-30 DIAGNOSIS — F324 Major depressive disorder, single episode, in partial remission: Secondary | ICD-10-CM | POA: Diagnosis not present

## 2024-09-30 DIAGNOSIS — F419 Anxiety disorder, unspecified: Secondary | ICD-10-CM | POA: Diagnosis not present

## 2024-09-30 DIAGNOSIS — E559 Vitamin D deficiency, unspecified: Secondary | ICD-10-CM | POA: Diagnosis not present

## 2024-09-30 DIAGNOSIS — G43009 Migraine without aura, not intractable, without status migrainosus: Secondary | ICD-10-CM | POA: Diagnosis not present

## 2024-11-07 DIAGNOSIS — G4730 Sleep apnea, unspecified: Secondary | ICD-10-CM | POA: Diagnosis not present

## 2024-11-07 DIAGNOSIS — G2581 Restless legs syndrome: Secondary | ICD-10-CM | POA: Diagnosis not present

## 2024-11-07 DIAGNOSIS — E669 Obesity, unspecified: Secondary | ICD-10-CM | POA: Diagnosis not present

## 2024-11-30 DIAGNOSIS — G4733 Obstructive sleep apnea (adult) (pediatric): Secondary | ICD-10-CM | POA: Diagnosis not present

## 2024-12-07 DIAGNOSIS — G4733 Obstructive sleep apnea (adult) (pediatric): Secondary | ICD-10-CM | POA: Diagnosis not present

## 2024-12-07 DIAGNOSIS — E669 Obesity, unspecified: Secondary | ICD-10-CM | POA: Diagnosis not present
